# Patient Record
Sex: Female | Born: 1980 | Race: White | Hispanic: No | Marital: Married | Smoking: Never smoker
Health system: Southern US, Community
[De-identification: ages and names within clinical notes are randomized; demographics above are authoritative.]

## PROBLEM LIST (undated history)

## (undated) DIAGNOSIS — B373 Candidiasis of vulva and vagina: Secondary | ICD-10-CM

## (undated) DIAGNOSIS — N63 Unspecified lump in unspecified breast: Secondary | ICD-10-CM

## (undated) DIAGNOSIS — B3731 Acute candidiasis of vulva and vagina: Secondary | ICD-10-CM

## (undated) DIAGNOSIS — Z9289 Personal history of other medical treatment: Secondary | ICD-10-CM

## (undated) DIAGNOSIS — N390 Urinary tract infection, site not specified: Secondary | ICD-10-CM

## (undated) HISTORY — DX: Candidiasis of vulva and vagina: B37.3

## (undated) HISTORY — DX: Personal history of other medical treatment: Z92.89

## (undated) HISTORY — DX: Unspecified lump in unspecified breast: N63.0

## (undated) HISTORY — DX: Urinary tract infection, site not specified: N39.0

## (undated) HISTORY — DX: Acute candidiasis of vulva and vagina: B37.31

---

## 2007-06-22 ENCOUNTER — Ambulatory Visit: Payer: Self-pay | Admitting: Family Medicine

## 2007-06-26 ENCOUNTER — Ambulatory Visit: Payer: Self-pay | Admitting: Physician Assistant

## 2008-08-04 ENCOUNTER — Ambulatory Visit: Payer: Self-pay | Admitting: Obstetrics & Gynecology

## 2008-08-05 ENCOUNTER — Inpatient Hospital Stay: Payer: Self-pay | Admitting: Obstetrics & Gynecology

## 2013-12-17 DIAGNOSIS — Z9289 Personal history of other medical treatment: Secondary | ICD-10-CM

## 2013-12-17 HISTORY — DX: Personal history of other medical treatment: Z92.89

## 2013-12-17 LAB — HM PAP SMEAR

## 2014-05-06 DIAGNOSIS — N63 Unspecified lump in unspecified breast: Secondary | ICD-10-CM

## 2014-05-06 HISTORY — DX: Unspecified lump in unspecified breast: N63.0

## 2014-05-19 ENCOUNTER — Ambulatory Visit: Payer: Self-pay | Admitting: Podiatry

## 2014-05-21 ENCOUNTER — Ambulatory Visit: Payer: Self-pay | Admitting: Obstetrics and Gynecology

## 2014-05-21 LAB — HM MAMMOGRAPHY

## 2015-03-03 ENCOUNTER — Ambulatory Visit (INDEPENDENT_AMBULATORY_CARE_PROVIDER_SITE_OTHER): Payer: BLUE CROSS/BLUE SHIELD | Admitting: Family Medicine

## 2015-03-03 ENCOUNTER — Encounter: Payer: Self-pay | Admitting: Family Medicine

## 2015-03-03 VITALS — BP 104/68 | HR 102 | Temp 98.7°F | Resp 18 | Ht 63.0 in | Wt 113.2 lb

## 2015-03-03 DIAGNOSIS — J019 Acute sinusitis, unspecified: Secondary | ICD-10-CM | POA: Diagnosis not present

## 2015-03-03 DIAGNOSIS — J40 Bronchitis, not specified as acute or chronic: Secondary | ICD-10-CM | POA: Diagnosis not present

## 2015-03-03 MED ORDER — DIFLUCAN 50 MG PO TABS: 50.0000 mg | ORAL_TABLET | Freq: Every day | ORAL | Status: DC

## 2015-03-03 MED ORDER — BENZONATATE 100 MG PO CAPS: 100.0000 mg | ORAL_CAPSULE | Freq: Two times a day (BID) | ORAL | Status: DC | PRN

## 2015-03-03 MED ORDER — FLUTICASONE PROPIONATE 50 MCG/ACT NA SUSP: 2.0000 | Freq: Every day | NASAL | Status: DC

## 2015-03-03 MED ORDER — PREDNISONE 20 MG PO TABS: 20.0000 mg | ORAL_TABLET | Freq: Every day | ORAL | Status: DC

## 2015-03-03 NOTE — Progress Notes (Signed)
Name: Christina Davis   MRN: [id number]    DOB: Nov 26, 1980   Date:03/03/2015       Progress Note  Subjective  Chief Complaint  Chief Complaint  Patient presents with  . Sinusitis  . Cough    HPI  Sinusitis  Patient presents with greater than 7 day history of nasal congestion and drainage which is purulent in color. There is tenderness over the sinuses. There has been fever to - along with some associated chills on occasion. Usage of over-the-counter medications is not been affected. There is also accompanying cough productive of purulent sputum.  Bronchitis  Patient presents with a greater than 1 week history of cough productive of purulent sputum. The cough is irritating and keep the patient awake at night. There has no fever or chills.  Over-the-counter meds And completely effective.  No past medical history on file.  Social History  Substance Use Topics  . Smoking status: Never Smoker   . Smokeless tobacco: Not on file  . Alcohol Use: 0.0 oz/week    0 Standard drinks or equivalent per week     Current outpatient prescriptions:  .  aspirin EC 81 MG tablet, Take by mouth., Disp: , Rfl:  .  Multiple Vitamin (MULTI-VITAMINS) TABS, Take by mouth., Disp: , Rfl:  .  VELIVET 0.1/0.125/0.15 -0.025 MG tablet, , Disp: , Rfl: 1  Allergies  Allergen Reactions  . Amoxicillin Diarrhea    Review of Systems  Constitutional: Positive for chills and malaise/fatigue. Negative for fever and weight loss.  HENT: Positive for congestion and sore throat. Negative for hearing loss and tinnitus.   Eyes: Negative for blurred vision, double vision and redness.  Respiratory: Positive for cough and sputum production. Negative for hemoptysis and shortness of breath.   Cardiovascular: Negative for chest pain, palpitations, orthopnea, claudication and leg swelling.  Gastrointestinal: Negative for heartburn, nausea, vomiting, diarrhea, constipation and blood in stool.  Genitourinary: Negative for  dysuria, urgency, frequency and hematuria.  Musculoskeletal: Positive for myalgias. Negative for back pain, joint pain, falls and neck pain.  Skin: Negative for itching.  Neurological: Positive for weakness. Negative for dizziness, tingling, tremors, focal weakness, seizures, loss of consciousness and headaches.  Endo/Heme/Allergies: Does not bruise/bleed easily.  Psychiatric/Behavioral: Negative for depression and substance abuse. The patient is not nervous/anxious and does not have insomnia.      Objective  Filed Vitals:   03/03/15 1505  BP: 104/68  Pulse: 102  Temp: 98.7 F (37.1 C)  Resp: 18  Height: 5\' 3"  (1.6 m)  Weight: 113 lb 4 oz (51.37 kg)  SpO2: 98%     Physical Exam  Constitutional: She is oriented to person, place, and time and well-developed, well-nourished, and in no distress.  HENT:  Head: Normocephalic.  Bilateral nasal turbinate swelling with erythema posterior pharyngeal drainage which is purulent. Mild tenderness over frontal and maxillary sinuses.  Eyes: EOM are normal. Pupils are equal, round, and reactive to light.  Neck: Normal range of motion. No thyromegaly present.  Cardiovascular: Normal rate, regular rhythm and normal heart sounds.   No murmur heard. Pulmonary/Chest: Effort normal and breath sounds normal.  Abdominal: Soft. Bowel sounds are normal.  Musculoskeletal: Normal range of motion. She exhibits no edema.  Neurological: She is alert and oriented to person, place, and time. No cranial nerve deficit. Gait normal.  Skin: Skin is warm and dry. No rash noted.  Psychiatric: Memory and affect normal.      Assessment & Plan  1. Acute sinusitis,  recurrence not specified, unspecified location Also saline gargles and sinus washes with saline solution - predniSONE (DELTASONE) 20 MG tablet; Take 1 tablet (20 mg total) by mouth daily with breakfast.  Dispense: 10 tablet; Refill: 0 - fluticasone (FLONASE) 50 MCG/ACT nasal spray; Place 2 sprays into  both nostrils daily.  Dispense: 16 g; Refill: 6 - DIFLUCAN 50 MG tablet; Take 1 tablet (50 mg total) by mouth daily.  Dispense: 2 tablet; Refill: 0  2. Bronchitis Suggestion of humidified air - benzonatate (TESSALON) 100 MG capsule; Take 1 capsule (100 mg total) by mouth 2 (two) times daily as needed for cough.  Dispense: 20 capsule; Refill: 0 - predniSONE (DELTASONE) 20 MG tablet; Take 1 tablet (20 mg total) by mouth daily with breakfast.  Dispense: 10 tablet; Refill: 0

## 2015-03-04 ENCOUNTER — Other Ambulatory Visit: Payer: Self-pay

## 2015-03-04 DIAGNOSIS — J40 Bronchitis, not specified as acute or chronic: Secondary | ICD-10-CM

## 2015-03-04 DIAGNOSIS — J019 Acute sinusitis, unspecified: Secondary | ICD-10-CM

## 2015-03-04 MED ORDER — DIFLUCAN 50 MG PO TABS: 50.0000 mg | ORAL_TABLET | Freq: Every day | ORAL | Status: DC

## 2015-03-04 MED ORDER — AZITHROMYCIN 250 MG PO TABS: ORAL_TABLET | ORAL | Status: DC

## 2016-05-25 ENCOUNTER — Telehealth: Payer: Self-pay

## 2016-05-25 DIAGNOSIS — N938 Other specified abnormal uterine and vaginal bleeding: Secondary | ICD-10-CM

## 2016-05-25 NOTE — Telephone Encounter (Signed)
Pt called stating she called a few weeks ago and wants to talk to you about that same issue.  Please see [facility] task of 05/02/16.  I called pt, she states she started a new pack of pills, bleeding did stop but it's trying to come on one and a half weeks early.  She feels her period just isn't doing right on these pills.  Adv I would give you the msg.  She can be reached at this same # tomorrow. [phone]

## 2016-05-26 NOTE — Telephone Encounter (Signed)
Pt with DUB again on OCPs. Has been on several brands of pills that work well at first, then pt gets BTB. Sx resolve temporarily with BC change. Had neg TSH 2014 but no recent labs. Will check labs and f/u. If neg, suggested trying nuvaring for sx control. Pt on 3rd wk of pills currently. If still sx, will check u/s.

## 2016-11-03 ENCOUNTER — Other Ambulatory Visit: Payer: Self-pay

## 2016-11-03 MED ORDER — DESOGEST-ETH ESTRAD TRIPHASIC 0.1/0.125/0.15 -0.025 MG PO TABS
1.0000 | ORAL_TABLET | Freq: Every day | ORAL | 2 refills | Status: DC
Start: 2016-11-03 — End: 2017-01-11

## 2016-11-09 ENCOUNTER — Telehealth: Payer: Self-pay | Admitting: Obstetrics and Gynecology

## 2016-11-09 NOTE — Telephone Encounter (Signed)
Pt is schedule 01/09/17 with [facility]. And needs an refill for for [facility]control. [facility].

## 2016-11-09 NOTE — Telephone Encounter (Signed)
Pt aware rx sent to [facility] [address] on 11/03/16. [facility]

## 2017-01-09 ENCOUNTER — Ambulatory Visit: Payer: Self-pay | Admitting: Obstetrics and Gynecology

## 2017-01-11 MED ORDER — DESOGEST-ETH ESTRAD TRIPHASIC 0.1/0.125/0.15 -0.025 MG PO TABS: 1.0000 | ORAL_TABLET | Freq: Every day | ORAL | 0 refills | Status: DC

## 2017-01-11 NOTE — Telephone Encounter (Signed)
Pt started her menses & had to r/s her AE. Her apt is 02/14/17 & she will be out of OCP prior to apt. Requesting an additional refill to get her to apt.

## 2017-01-11 NOTE — Addendum Note (Signed)
Addended by: Meryl Dare on: 01/11/2017 10:00 AM   Modules accepted: Orders

## 2017-01-11 NOTE — Telephone Encounter (Signed)
Pt aware additional rf sent to [facility] [address]

## 2017-02-01 ENCOUNTER — Other Ambulatory Visit: Payer: Self-pay

## 2017-02-01 MED ORDER — DESOGEST-ETH ESTRAD TRIPHASIC 0.1/0.125/0.15 -0.025 MG PO TABS: 1.0000 | ORAL_TABLET | Freq: Every day | ORAL | 0 refills | Status: DC

## 2017-02-08 ENCOUNTER — Other Ambulatory Visit: Payer: Self-pay | Admitting: Obstetrics and Gynecology

## 2017-02-08 NOTE — Telephone Encounter (Signed)
Patient account linked. Forwarding to [facility]

## 2017-02-14 ENCOUNTER — Ambulatory Visit: Payer: Self-pay | Admitting: Obstetrics and Gynecology

## 2017-02-14 ENCOUNTER — Ambulatory Visit (INDEPENDENT_AMBULATORY_CARE_PROVIDER_SITE_OTHER): Payer: 59 | Admitting: Obstetrics and Gynecology

## 2017-02-14 ENCOUNTER — Encounter: Payer: Self-pay | Admitting: Obstetrics and Gynecology

## 2017-02-14 VITALS — BP 120/70 | HR 100 | Ht 63.0 in | Wt 113.0 lb

## 2017-02-14 DIAGNOSIS — D242 Benign neoplasm of left breast: Secondary | ICD-10-CM

## 2017-02-14 DIAGNOSIS — Z01419 Encounter for gynecological examination (general) (routine) without abnormal findings: Secondary | ICD-10-CM

## 2017-02-14 DIAGNOSIS — Z3041 Encounter for surveillance of contraceptive pills: Secondary | ICD-10-CM

## 2017-02-14 DIAGNOSIS — Z1151 Encounter for screening for human papillomavirus (HPV): Secondary | ICD-10-CM | POA: Diagnosis not present

## 2017-02-14 DIAGNOSIS — Z124 Encounter for screening for malignant neoplasm of cervix: Secondary | ICD-10-CM

## 2017-02-14 MED ORDER — DESOGEST-ETH ESTRAD TRIPHASIC 0.1/0.125/0.15 -0.025 MG PO TABS: 1.0000 | ORAL_TABLET | Freq: Every day | ORAL | 3 refills | Status: DC

## 2017-02-14 NOTE — Progress Notes (Addendum)
PCP:  Ashok Norris, MD   Chief Complaint  Patient presents with  . Gynecologic Exam     HPI:      [name] is a 36 y.o. [id number] who LMP was Patient's last menstrual period was 01/09/2017., presents today for her annual examination.  Her menses are regular every 2 months on OCPs, lasting 5 days.  Dysmenorrhea mild, occurring first 1-2 days of flow. She does not have intermenstrual bleeding.  Sex activity: single partner, contraception - OCP (estrogen/progesterone).  Last Pap: December 17, 2013  Results were: no abnormalities /neg HPV DNA  Hx of STDs: none  Last mammogram: May 21, 2014  Results were: cat 3 for probable fibroadenoma LT breast, 6 mo f/u u/s recommended. Pt feels that area is smaller and is not bothersome to her. She never had repeat u/s last yr. There is no FH of breast cancer. There is no FH of ovarian cancer. The patient does do self-breast exams.  Tobacco use: The patient denies current or previous tobacco use. Alcohol use: none No drug use.  Exercise: not active  She does get adequate calcium and Vitamin D in her diet.   Past Medical History:  Diagnosis Date  . Breast mass 05/2014   U/S REPEAT IN 9/16 (WASN'T DONE)  . History of Papanicolaou smear of cervix 12/17/2013   -/-  . UTI (urinary tract infection)   . Yeast vaginitis     Past Surgical History:  Procedure Laterality Date  . CESAREAN SECTION  2010    Family History  Problem Relation Age of Onset  . Depression Mother   . Stroke Maternal Grandfather     Social History   Socioeconomic History  . Marital status: Married    Spouse name: Not on file  . Number of children: 2  . Years of education: 60  . Highest education level: Not on file  Social Needs  . Financial resource strain: Not on file  . Food insecurity - worry: Not on file  . Food insecurity - inability: Not on file  . Transportation needs - medical: Not on file  . Transportation needs - non-medical: Not on  file  Occupational History  . Occupation: ADMINISTRATIVE ASSOCIATE    Comment: ASSISTANT  Tobacco Use  . Smoking status: Never Smoker  . Smokeless tobacco: Never Used  Substance and Sexual Activity  . Alcohol use: Yes    Alcohol/week: 0.0 oz    Comment: OCC  . Drug use: No  . Sexual activity: Yes    Birth control/protection: Pill  Other Topics Concern  . Not on file  Social History Narrative  . Not on file    Current Meds  Medication Sig  . desogestrel-ethinyl estradiol (VELIVET) 0.1/0.125/0.15 -0.025 MG tablet Take 1 tablet by mouth daily.  . [DISCONTINUED] VELIVET 0.1/0.125/0.15 -0.025 MG tablet TAKE 1 TABLET EVERY DAY     ROS:  Review of Systems  Constitutional: Negative for fatigue, fever and unexpected weight change.  Respiratory: Negative for cough, shortness of breath and wheezing.   Cardiovascular: Negative for chest pain, palpitations and leg swelling.  Gastrointestinal: Negative for blood in stool, constipation, diarrhea, nausea and vomiting.  Endocrine: Negative for cold intolerance, heat intolerance and polyuria.  Genitourinary: Negative for dyspareunia, dysuria, flank pain, frequency, genital sores, hematuria, menstrual problem, pelvic pain, urgency, vaginal bleeding, vaginal discharge and vaginal pain.  Musculoskeletal: Negative for back pain, joint swelling and myalgias.  Skin: Negative for rash.  Neurological: Negative for dizziness, syncope, light-headedness,  numbness and headaches.  Hematological: Negative for adenopathy.  Psychiatric/Behavioral: Negative for agitation, confusion, sleep disturbance and suicidal ideas. The patient is not nervous/anxious.      Objective: BP 120/70   Pulse 100   Ht 5\' 3"  (1.6 m)   Wt 113 lb (51.3 kg)   LMP 01/09/2017   BMI 20.02 kg/m    Physical Exam  Constitutional: She is oriented to person, place, and time. She appears well-developed and well-nourished.  Genitourinary: Vagina normal and uterus normal. There is  no rash or tenderness on the right labia. There is no rash or tenderness on the left labia. No erythema or tenderness in the vagina. No vaginal discharge found. Right adnexum does not display mass and does not display tenderness. Left adnexum does not display mass and does not display tenderness. Cervix does not exhibit motion tenderness or polyp. Uterus is not enlarged or tender.  Neck: Normal range of motion. No thyromegaly present.  Cardiovascular: Normal rate, regular rhythm and normal heart sounds.  No murmur heard. Pulmonary/Chest: Effort normal and breath sounds normal. Right breast exhibits no mass, no nipple discharge, no skin change and no tenderness. Left breast exhibits mass. Left breast exhibits no nipple discharge, no skin change and no tenderness.  LT BREAST 8:00 WITH ~1.5 X 2.0 CM FIRM, MOBILE MASS    Abdominal: Soft. There is no tenderness. There is no guarding.  Musculoskeletal: Normal range of motion.  Neurological: She is alert and oriented to person, place, and time. No cranial nerve deficit.  Skin: Skin is warm and dry.  Psychiatric: She has a normal mood and affect. Her behavior is normal. Thought content normal.  Vitals reviewed.    Assessment/Plan: Encounter for annual routine gynecological examination  Cervical cancer screening - Plan: IGP, Aptima HPV  Screening for HPV (human papillomavirus) - Plan: IGP, Aptima HPV  Encounter for surveillance of contraceptive pills - OCP RF. - Plan: desogestrel-ethinyl estradiol (VELIVET) 0.1/0.125/0.15 -0.025 MG tablet  Fibroadenoma of breast, left - Repeat u/s due. Pt to call to sched.  - Plan: US BREAST LTD UNI LEFT INC AXILLA, MM DIAG BREAST TOMO BILATERAL, US BREAST LTD UNI RIGHT INC AXILLA  Meds ordered this encounter  Medications  . desogestrel-ethinyl estradiol (VELIVET) 0.1/0.125/0.15 -0.025 MG tablet    Sig: Take 1 tablet by mouth daily.    Dispense:  84 tablet    Refill:  3             GYN counsel breast self  exam, adequate intake of calcium and vitamin D, diet and exercise     F/U  Return in about 1 year (around 02/14/2018).  Chiquita Heckert B. Seyon Strader, PA-C 02/15/2017 4:04 PM

## 2017-02-14 NOTE — Patient Instructions (Signed)
I value your feedback and entrusting us with your care. If you get a [place] patient survey, I would appreciate you taking the time to let us know about your experience today. Thank you! 

## 2017-02-15 NOTE — Addendum Note (Signed)
Addended by: Ardeth Perfect B on: [id number] 04:04 PM   Modules accepted: Orders

## 2017-02-18 LAB — IGP, APTIMA HPV
HPV Aptima: NEGATIVE
PAP SMEAR COMMENT: 0

## 2017-02-27 ENCOUNTER — Other Ambulatory Visit: Payer: Self-pay

## 2017-02-27 DIAGNOSIS — Z3041 Encounter for surveillance of contraceptive pills: Secondary | ICD-10-CM

## 2017-02-27 MED ORDER — DESOGEST-ETH ESTRAD TRIPHASIC 0.1/0.125/0.15 -0.025 MG PO TABS: 1.0000 | ORAL_TABLET | Freq: Every day | ORAL | 3 refills | Status: DC

## 2017-03-31 ENCOUNTER — Ambulatory Visit
Admission: RE | Admit: 2017-03-31 | Discharge: 2017-03-31 | Disposition: A | Discharge status: Home / Self Care | Payer: 59 | Source: Ambulatory Visit | Attending: Obstetrics and Gynecology | Admitting: Obstetrics and Gynecology

## 2017-03-31 ENCOUNTER — Encounter: Payer: Self-pay | Admitting: Radiology

## 2017-03-31 DIAGNOSIS — D242 Benign neoplasm of left breast: Secondary | ICD-10-CM

## 2017-03-31 DIAGNOSIS — R922 Inconclusive mammogram: Secondary | ICD-10-CM | POA: Diagnosis not present

## 2017-04-03 ENCOUNTER — Other Ambulatory Visit: Payer: Self-pay | Admitting: Obstetrics and Gynecology

## 2017-04-03 DIAGNOSIS — N6320 Unspecified lump in the left breast, unspecified quadrant: Secondary | ICD-10-CM

## 2017-04-03 DIAGNOSIS — R928 Other abnormal and inconclusive findings on diagnostic imaging of breast: Secondary | ICD-10-CM

## 2017-04-12 ENCOUNTER — Ambulatory Visit: Payer: 59

## 2017-05-31 ENCOUNTER — Ambulatory Visit
Admission: RE | Admit: 2017-05-31 | Discharge: 2017-05-31 | Disposition: A | Discharge status: Home / Self Care | Payer: 59 | Source: Ambulatory Visit | Attending: Obstetrics and Gynecology | Admitting: Obstetrics and Gynecology

## 2017-05-31 DIAGNOSIS — R928 Other abnormal and inconclusive findings on diagnostic imaging of breast: Secondary | ICD-10-CM

## 2017-05-31 DIAGNOSIS — N6320 Unspecified lump in the left breast, unspecified quadrant: Secondary | ICD-10-CM | POA: Diagnosis present

## 2017-05-31 DIAGNOSIS — N6324 Unspecified lump in the left breast, lower inner quadrant: Secondary | ICD-10-CM | POA: Diagnosis not present

## 2017-05-31 DIAGNOSIS — D242 Benign neoplasm of left breast: Secondary | ICD-10-CM | POA: Diagnosis not present

## 2017-05-31 HISTORY — PX: BREAST BIOPSY: SHX20

## 2017-06-01 LAB — SURGICAL PATHOLOGY

## 2017-06-12 ENCOUNTER — Encounter: Payer: Self-pay | Admitting: Obstetrics and Gynecology

## 2018-03-11 DIAGNOSIS — S0502XA Injury of conjunctiva and corneal abrasion without foreign body, left eye, initial encounter: Secondary | ICD-10-CM | POA: Diagnosis not present

## 2018-04-02 ENCOUNTER — Telehealth: Payer: Self-pay

## 2018-04-02 ENCOUNTER — Other Ambulatory Visit: Payer: Self-pay | Admitting: Obstetrics and Gynecology

## 2018-04-02 DIAGNOSIS — Z3041 Encounter for surveillance of contraceptive pills: Secondary | ICD-10-CM

## 2018-04-02 NOTE — Telephone Encounter (Signed)
Pt calling for refill of bcp.  She is out as of yesterday.  Knows she needs appt.  [phone]

## 2018-04-03 MED ORDER — DESOGEST-ETH ESTRAD TRIPHASIC 0.1/0.125/0.15 -0.025 MG PO TABS: 1.0000 | ORAL_TABLET | Freq: Every day | ORAL | 0 refills | Status: DC

## 2018-04-03 NOTE — Telephone Encounter (Signed)
Patient is schedule 04/18/18 with [facility] at 8:00 am. Please advise refill on prescription

## 2018-04-03 NOTE — Addendum Note (Signed)
Addended by: Cleophas Dunker D on: 04/03/2018 08:13 AM   Modules accepted: Orders

## 2018-04-03 NOTE — Telephone Encounter (Signed)
Pt aware. Adv to take two a day until she is caught up.

## 2018-04-17 NOTE — Progress Notes (Signed)
PCP:  Ashok Norris, MD   Chief Complaint  Patient presents with  . Gynecologic Exam     HPI:      [name] is a 38 y.o. [id number] who LMP was Patient's last menstrual period was 02/19/2018 (approximate)., presents today for her annual examination.  Her menses are regular every 2 months on OCPs, lasting 5 days.  Dysmenorrhea mod, occurring first 1-2 days of flow. She does not have intermenstrual bleeding.  Sex activity: single partner, contraception - OCP (estrogen/progesterone).  Last Pap: 02/14/17 Results were: no abnormalities /neg HPV DNA  Hx of STDs: none  Last mammogram: 03/31/17  Results were: cat 3 for probable fibroadenoma LT breast, 6 mo f/u u/s recommended. 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 Yearly mammo recommended.  There is no FH of breast cancer. There is no FH of ovarian cancer. The patient does do self-breast exams.  Tobacco use: The patient denies current or previous tobacco use. Alcohol use: social No drug use.  Exercise: not active  She does get adequate calcium but not Vitamin D in her diet.   Past Medical History:  Diagnosis Date  . Breast mass 05/2014   U/S REPEAT IN 9/16 (WASN'T DONE)  . History of Papanicolaou smear of cervix 12/17/2013   -/-  . UTI (urinary tract infection)   . Yeast vaginitis     Past Surgical History:  Procedure Laterality Date  . 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 CESAREAN SECTION  2010    Family History  Problem Relation Age of Onset  . Depression Mother   . Stroke Maternal Grandfather   . Breast cancer Neg Hx     Social History   Socioeconomic History  . Marital status: Married    Spouse name: Not on file  . Number of children: 2  . Years of education: 50  . Highest education level: Not on file  Occupational History  . Occupation: ADMINISTRATIVE ASSOCIATE    Comment: ASSISTANT  Social Needs  .  Financial resource strain: Not on file  . Food insecurity:    Worry: Not on file    Inability: Not on file  . Transportation needs:    Medical: Not on file    Non-medical: Not on file  Tobacco Use  . Smoking status: Never Smoker  . Smokeless tobacco: Never Used  Substance and Sexual Activity  . Alcohol use: Yes    Alcohol/week: 0.0 standard drinks    Comment: OCC  . Drug use: No  . Sexual activity: Yes    Birth control/protection: Pill  Lifestyle  . Physical activity:    Days per week: Not on file    Minutes per session: Not on file  . Stress: Not on file  Relationships  . Social connections:    Talks on phone: Not on file    Gets together: Not on file    Attends religious service: Not on file    Active member of club or organization: Not on file    Attends meetings of clubs or organizations: Not on file    Relationship status: Not on file  . Intimate partner violence:    Fear of current or ex partner: Not on file    Emotionally abused: Not on file    Physically abused: Not on file    Forced sexual activity: Not on file  Other Topics Concern  . Not on file  Social History  Narrative  . Not on file    Current Meds  Medication Sig  . aspirin EC 81 MG tablet Take by mouth.  . desogestrel-ethinyl estradiol (VELIVET) 0.1/0.125/0.15 -0.025 MG tablet Take 1 tablet by mouth daily.  . Multiple Vitamin (MULTI-VITAMINS) TABS Take by mouth.  . [DISCONTINUED] desogestrel-ethinyl estradiol (VELIVET) 0.1/0.125/0.15 -0.025 MG tablet Take 1 tablet by mouth daily.     ROS:  Review of Systems  Constitutional: Negative for fatigue, fever and unexpected weight change.  Respiratory: Negative for cough, shortness of breath and wheezing.   Cardiovascular: Negative for chest pain, palpitations and leg swelling.  Gastrointestinal: Negative for blood in stool, constipation, diarrhea, nausea and vomiting.  Endocrine: Negative for cold intolerance, heat intolerance and polyuria.    Genitourinary: Negative for dyspareunia, dysuria, flank pain, frequency, genital sores, hematuria, menstrual problem, pelvic pain, urgency, vaginal bleeding, vaginal discharge and vaginal pain.  Musculoskeletal: Negative for back pain, joint swelling and myalgias.  Skin: Negative for rash.  Neurological: Negative for dizziness, syncope, light-headedness, numbness and headaches.  Hematological: Negative for adenopathy.  Psychiatric/Behavioral: Negative for agitation, confusion, sleep disturbance and suicidal ideas. The patient is not nervous/anxious.      Objective: BP 100/62   Pulse 83   Ht 5\' 3"  (1.6 m)   Wt 114 lb (51.7 kg)   LMP 02/19/2018 (Approximate)   BMI 20.19 kg/m    Physical Exam Constitutional:      General: She is not in acute distress.    Appearance: She is well-developed.  Genitourinary:     Vulva, vagina, uterus, right adnexa and left adnexa normal.     No vulval lesion, tenderness or ulcerations noted.     No vaginal discharge, erythema, tenderness or bleeding.     No cervical motion tenderness or polyp.     Uterus is not enlarged or tender.     No right or left adnexal mass present.     Right adnexa not tender.     Left adnexa not tender.  Neck:     Musculoskeletal: Normal range of motion.     Thyroid: No thyromegaly.  Cardiovascular:     Rate and Rhythm: Normal rate and regular rhythm.     Heart sounds: Normal heart sounds. No murmur.  Pulmonary:     Effort: Pulmonary effort is normal.     Breath sounds: Normal breath sounds.  Chest:     Breasts:        Right: No mass, nipple discharge, skin change or tenderness.        Left: Mass present. No nipple discharge, skin change or tenderness.    Abdominal:     Palpations: Abdomen is soft.     Tenderness: There is no abdominal tenderness. There is no guarding.  Musculoskeletal: Normal range of motion.  Neurological:     General: No focal deficit present.     Mental Status: She is alert and oriented to  person, place, and time.     Cranial Nerves: No cranial nerve deficit.  Skin:    General: Skin is warm and dry.  Psychiatric:        Mood and Affect: Mood normal.        Behavior: Behavior normal.        Thought Content: Thought content normal.        Judgment: Judgment normal.  Vitals signs and nursing note reviewed.     Assessment/Plan: Encounter for annual routine gynecological examination  Encounter for surveillance of contraceptive pills - OCP  RF. - Plan: desogestrel-ethinyl estradiol (VELIVET) 0.1/0.125/0.15 -0.025 MG tablet  Screening for breast cancer - Pt to sched mammo - Plan: MM 3D SCREEN BREAST BILATERAL  Meds ordered this encounter  Medications  . desogestrel-ethinyl estradiol (VELIVET) 0.1/0.125/0.15 -0.025 MG tablet    Sig: Take 1 tablet by mouth daily.    Dispense:  84 tablet    Refill:  3    Order Specific Question:   Supervising Provider    Answer:   Gae Dry [[id number]]             GYN counsel breast self exam, adequate intake of calcium and vitamin D, diet and exercise     F/U  Return in about 1 year (around 04/19/2019).  Tyrell Brereton B. Ting Cage, PA-C 04/18/2018 9:30 AM

## 2018-04-18 ENCOUNTER — Encounter: Payer: Self-pay | Admitting: Obstetrics and Gynecology

## 2018-04-18 ENCOUNTER — Ambulatory Visit (INDEPENDENT_AMBULATORY_CARE_PROVIDER_SITE_OTHER): Payer: 59 | Admitting: Obstetrics and Gynecology

## 2018-04-18 VITALS — BP 100/62 | HR 83 | Ht 63.0 in | Wt 114.0 lb

## 2018-04-18 DIAGNOSIS — Z1239 Encounter for other screening for malignant neoplasm of breast: Secondary | ICD-10-CM

## 2018-04-18 DIAGNOSIS — Z01419 Encounter for gynecological examination (general) (routine) without abnormal findings: Secondary | ICD-10-CM

## 2018-04-18 DIAGNOSIS — Z3041 Encounter for surveillance of contraceptive pills: Secondary | ICD-10-CM

## 2018-04-18 MED ORDER — DESOGEST-ETH ESTRAD TRIPHASIC 0.1/0.125/0.15 -0.025 MG PO TABS: 1.0000 | ORAL_TABLET | Freq: Every day | ORAL | 3 refills | Status: DC

## 2018-04-18 NOTE — Patient Instructions (Signed)
I value your feedback and entrusting us with your care. If you get a [place] patient survey, I would appreciate you taking the time to let us know about your experience today. Thank you!  [facility]: [phone]    

## 2018-05-25 ENCOUNTER — Telehealth: Payer: Self-pay

## 2018-05-25 NOTE — Telephone Encounter (Signed)
Pt called stating pharm doesn't have rx for bc.  [facility] was going to send it in. Should have been sent to [place] and [facility]. [phone]  Called pharm. Spoke c female. They have pt with the last name of Byerly - same DOB and address.  They had rx and will get it ready for pt.  Adv pt to take her ins card in so they can get the correct information into their system.

## 2018-06-17 ENCOUNTER — Other Ambulatory Visit: Payer: Self-pay | Admitting: Obstetrics and Gynecology

## 2018-06-17 DIAGNOSIS — Z3041 Encounter for surveillance of contraceptive pills: Secondary | ICD-10-CM

## 2018-11-20 ENCOUNTER — Ambulatory Visit (INDEPENDENT_AMBULATORY_CARE_PROVIDER_SITE_OTHER): Payer: 59 | Admitting: Obstetrics and Gynecology

## 2018-11-20 ENCOUNTER — Other Ambulatory Visit: Payer: Self-pay

## 2018-11-20 ENCOUNTER — Ambulatory Visit: Payer: 59 | Admitting: Obstetrics and Gynecology

## 2018-11-20 ENCOUNTER — Encounter: Payer: Self-pay | Admitting: Obstetrics and Gynecology

## 2018-11-20 VITALS — BP 100/80 | Ht 63.0 in | Wt 116.0 lb

## 2018-11-20 DIAGNOSIS — N898 Other specified noninflammatory disorders of vagina: Secondary | ICD-10-CM

## 2018-11-20 MED ORDER — FLUCONAZOLE 150 MG PO TABS: 150.0000 mg | ORAL_TABLET | Freq: Once | ORAL | 0 refills | Status: AC

## 2018-11-20 NOTE — Progress Notes (Signed)
Christina Norris, MD   Chief Complaint  Patient presents with  . Vaginitis    discharge, little itchiness and irritation, no odor x 2 days    HPI:      [name] is a 38 y.o. [id number] who LMP was No LMP recorded (lmp unknown). (Menstrual status: Oral contraceptives)., presents today for increased vag d/c without irritation/fishy odor for 2 days. Hx of yeast vag in past, but not recently. OTC yeast meds don't work for her. No prior abx use, no new soaps/detergents. No meds to treat, no urin sx.    Past Medical History:  Diagnosis Date  . Breast mass 05/2014   U/S REPEAT IN 9/16 (WASN'T DONE)  . History of Papanicolaou smear of cervix 12/17/2013   -/-  . UTI (urinary tract infection)   . Yeast vaginitis     Past Surgical History:  Procedure Laterality Date  . 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 CESAREAN SECTION  2010    Family History  Problem Relation Age of Onset  . Depression Mother   . Stroke Maternal Grandfather   . Breast cancer Neg Hx     Social History   Socioeconomic History  . Marital status: Married    Spouse name: Not on file  . Number of children: 2  . Years of education: 76  . Highest education level: Not on file  Occupational History  . Occupation: ADMINISTRATIVE ASSOCIATE    Comment: ASSISTANT  Social Needs  . Financial resource strain: Not on file  . Food insecurity    Worry: Not on file    Inability: Not on file  . Transportation needs    Medical: Not on file    Non-medical: Not on file  Tobacco Use  . Smoking status: Never Smoker  . Smokeless tobacco: Never Used  Substance and Sexual Activity  . Alcohol use: Yes    Alcohol/week: 0.0 standard drinks    Comment: OCC  . Drug use: No  . Sexual activity: Yes    Birth control/protection: Pill  Lifestyle  . Physical activity    Days per week: Not on file    Minutes per session: Not on file  . Stress: Not on  file  Relationships  . Social Herbalist on phone: Not on file    Gets together: Not on file    Attends religious service: Not on file    Active member of club or organization: Not on file    Attends meetings of clubs or organizations: Not on file    Relationship status: Not on file  . Intimate partner violence    Fear of current or ex partner: Not on file    Emotionally abused: Not on file    Physically abused: Not on file    Forced sexual activity: Not on file  Other Topics Concern  . Not on file  Social History Narrative  . Not on file    Outpatient Medications Prior to Visit  Medication Sig Dispense Refill  . Multiple Vitamin (MULTI-VITAMINS) TABS Take by mouth.    . VELIVET 0.1/0.125/0.15 -0.025 MG tablet TAKE 1 TABLET BY MOUTH EVERY DAY 84 tablet 0  . aspirin EC 81 MG tablet Take by mouth.     No facility-administered medications prior to visit.       ROS:  Review of Systems  Constitutional: Negative for fever.  Gastrointestinal: Negative for blood in stool, constipation,  diarrhea, nausea and vomiting.  Genitourinary: Positive for vaginal discharge. Negative for dyspareunia, dysuria, flank pain, frequency, hematuria, urgency, vaginal bleeding and vaginal pain.  Musculoskeletal: Negative for back pain.  Skin: Negative for rash.    OBJECTIVE:   Vitals:  BP 100/80   Ht 5\' 3"  (1.6 m)   Wt 116 lb (52.6 kg)   LMP  (LMP Unknown)   BMI 20.55 kg/m   Physical Exam Vitals signs reviewed.  Constitutional:      Appearance: She is well-developed.  Neck:     Musculoskeletal: Normal range of motion.  Pulmonary:     Effort: Pulmonary effort is normal.  Genitourinary:    General: Normal vulva.     Pubic Area: No rash.      Labia:        Right: No rash, tenderness or lesion.        Left: No rash, tenderness or lesion.      Vagina: Normal. No vaginal discharge, erythema or tenderness.     Cervix: Normal.     Uterus: Normal. Not enlarged and not tender.       Adnexa: Right adnexa normal and left adnexa normal.       Right: No mass or tenderness.         Left: No mass or tenderness.    Musculoskeletal: Normal range of motion.  Skin:    General: Skin is warm and dry.  Neurological:     General: No focal deficit present.     Mental Status: She is alert and oriented to person, place, and time.  Psychiatric:        Mood and Affect: Mood normal.        Behavior: Behavior normal.        Thought Content: Thought content normal.        Judgment: Judgment normal.     Results: Results for orders placed or performed in visit on 11/20/18 (from the past 24 hour(s))  POCT Wet Prep with KOH     Status: Normal   Collection Time: 11/21/18  8:47 AM  Result Value Ref Range   Trichomonas, UA Negative    Clue Cells Wet Prep HPF POC neg    Epithelial Wet Prep HPF POC     Yeast Wet Prep HPF POC neg    Bacteria Wet Prep HPF POC     RBC Wet Prep HPF POC     WBC Wet Prep HPF POC     KOH Prep POC Negative Negative     Assessment/Plan: Vaginal discharge - Plan: fluconazole (DIFLUCAN) 150 MG tablet, POCT Wet Prep with KOH; Neg wet prep/exam. No irritation. Question early yeast vs normal d/c. Rx diflucan eRxd. Pt to see if sx develop and will treat prn. F/u if sx change to BV type sx.    Meds ordered this encounter  Medications  . fluconazole (DIFLUCAN) 150 MG tablet    Sig: Take 1 tablet (150 mg total) by mouth once for 1 dose.    Dispense:  1 tablet    Refill:  0    Order Specific Question:   Supervising Provider    Answer:   Gae Dry [id number]      Return if symptoms worsen or fail to improve.  Shandelle Borrelli B. Cipriana Biller, PA-C 11/21/2018 8:48 AM

## 2018-11-20 NOTE — Patient Instructions (Signed)
I value your feedback and entrusting us with your care. If you get a [facility] patient survey, I would appreciate you taking the time to let us know about your experience today. Thank you! 

## 2018-11-21 ENCOUNTER — Encounter: Payer: Self-pay | Admitting: Obstetrics and Gynecology

## 2018-11-21 LAB — POCT WET PREP WITH KOH
Clue Cells Wet Prep HPF POC: NEGATIVE
KOH Prep POC: NEGATIVE
Trichomonas, UA: NEGATIVE
Yeast Wet Prep HPF POC: NEGATIVE

## 2019-04-25 ENCOUNTER — Telehealth: Payer: Self-pay | Admitting: Obstetrics and Gynecology

## 2019-04-25 ENCOUNTER — Other Ambulatory Visit: Payer: Self-pay

## 2019-04-25 DIAGNOSIS — Z3041 Encounter for surveillance of contraceptive pills: Secondary | ICD-10-CM

## 2019-04-25 MED ORDER — VELIVET 0.1/0.125/0.15 -0.025 MG PO TABS: 1.0000 | ORAL_TABLET | Freq: Every day | ORAL | 0 refills | Status: DC

## 2019-04-25 NOTE — Telephone Encounter (Signed)
Patient needs refill on bc, annual scheduled 2/24 but will run out before that time.  Russell

## 2019-04-25 NOTE — Telephone Encounter (Signed)
RF sent.

## 2019-04-27 ENCOUNTER — Other Ambulatory Visit: Payer: Self-pay | Admitting: Obstetrics and Gynecology

## 2019-04-27 DIAGNOSIS — Z3041 Encounter for surveillance of contraceptive pills: Secondary | ICD-10-CM

## 2019-04-30 NOTE — Patient Instructions (Addendum)
I value your feedback and entrusting us with your care. If you get a [facility] patient survey, I would appreciate you taking the time to let us know about your experience today. Thank you! ° °As of February 14, 2019, your lab results will be released to your MyChart immediately, before I even have a chance to see them. Please give me time to review them and contact you if there are any abnormalities. Thank you for your patience.  ° °[facility]: [phone] ° ° ° °

## 2019-04-30 NOTE — Progress Notes (Signed)
PCP:  Ashok Norris, MD   Chief Complaint  Patient presents with  . Gynecologic Exam     HPI:      Christina Davis is a 39 y.o. [id number] who LMP was Patient's last menstrual period was 04/24/2019 (approximate)., presents today for her annual examination.  Her menses are regular every 2-3 months with OCPs, lasting 3-5 days.  Dysmenorrhea mod, occurring first 1-2 days of flow. She does nothave intermenstrual bleeding.  Sex activity: single partner, contraception - OCP (estrogen/progesterone).  Last Pap: 02/14/17 Results were: no abnormalities /neg HPV DNA  Hx of STDs: none  Last mammogram: 03/31/17  Results were: cat 3 for probable fibroadenoma LT breast.  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 Yearly mammo recommended; hasn't had one yet. Concerned about risk of radiation. Fibroadenoma is stable per pt. There is no FH of breast cancer. There is no FH of ovarian cancer. The patient does do self-breast exams.  Tobacco use: The patient denies current or previous tobacco use. Alcohol use: social No drug use.  Exercise: mod active  She does get adequate calcium and Vitamin D in her diet. No recent labs, PCP retired.   Past Medical History:  Diagnosis Date  . Breast mass 05/2014   U/S REPEAT IN 9/16 (WASN'T DONE)  . History of Papanicolaou smear of cervix 12/17/2013   -/-  . UTI (urinary tract infection)   . Yeast vaginitis     Past Surgical History:  Procedure Laterality Date  . 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 CESAREAN SECTION  2010    Family History  Problem Relation Age of Onset  . Depression Mother   . Stroke Maternal Grandfather   . Breast cancer Neg Hx     Social History   Socioeconomic History  . Marital status: Married    Spouse name: Not on file  . Number of children: 2  . Years of education: 54  . Highest education level: Not on file  Occupational  History  . Occupation: ADMINISTRATIVE ASSOCIATE    Comment: ASSISTANT  Tobacco Use  . Smoking status: Never Smoker  . Smokeless tobacco: Never Used  Substance and Sexual Activity  . Alcohol use: Yes    Alcohol/week: 0.0 standard drinks    Comment: OCC  . Drug use: No  . Sexual activity: Yes    Birth control/protection: Pill  Other Topics Concern  . Not on file  Social History Narrative  . Not on file   Social Determinants of Health   Financial Resource Strain:   . Difficulty of Paying Living Expenses: Not on file  Food Insecurity:   . Worried About Charity fundraiser in the Last Year: Not on file  . Ran Out of Food in the Last Year: Not on file  Transportation Needs:   . Lack of Transportation (Medical): Not on file  . Lack of Transportation (Non-Medical): Not on file  Physical Activity:   . Days of Exercise per Week: Not on file  . Minutes of Exercise per Session: Not on file  Stress:   . Feeling of Stress : Not on file  Social Connections:   . Frequency of Communication with Friends and Family: Not on file  . Frequency of Social Gatherings with Friends and Family: Not on file  . Attends Religious Services: Not on file  . Active Member of Clubs or Organizations: Not on file  . Attends Club or  Organization Meetings: Not on file  . Marital Status: Not on file  Intimate Partner Violence:   . Fear of Current or Ex-Partner: Not on file  . Emotionally Abused: Not on file  . Physically Abused: Not on file  . Sexually Abused: Not on file    Current Meds  Medication Sig  . Multiple Vitamin (MULTI-VITAMINS) TABS Take by mouth.  . [DISCONTINUED] VELIVET 0.1/0.125/0.15 -0.025 MG tablet TAKE 1 TABLET BY MOUTH DAILY  . desogestrel-ethinyl estradiol (VELIVET) 0.1/0.125/0.15 -0.025 MG tablet Take 1 tablet by mouth daily.     ROS:  Review of Systems  Constitutional: Negative for fatigue, fever and unexpected weight change.  Respiratory: Negative for cough, shortness of  breath and wheezing.   Cardiovascular: Negative for chest pain, palpitations and leg swelling.  Gastrointestinal: Negative for blood in stool, constipation, diarrhea, nausea and vomiting.  Endocrine: Negative for cold intolerance, heat intolerance and polyuria.  Genitourinary: Negative for dyspareunia, dysuria, flank pain, frequency, genital sores, hematuria, menstrual problem, pelvic pain, urgency, vaginal bleeding, vaginal discharge and vaginal pain.  Musculoskeletal: Negative for back pain, joint swelling and myalgias.  Skin: Negative for rash.  Neurological: Negative for dizziness, syncope, light-headedness, numbness and headaches.  Hematological: Negative for adenopathy.  Psychiatric/Behavioral: Negative for agitation, confusion, sleep disturbance and suicidal ideas. The patient is not nervous/anxious.      Objective: BP 120/80   Ht 5\' 3"  (1.6 m)   Wt 117 lb (53.1 kg)   LMP 04/24/2019 (Approximate)   BMI 20.73 kg/m    Physical Exam Constitutional:      General: She is not in acute distress.    Appearance: She is well-developed.  Genitourinary:     Vulva, vagina, uterus, right adnexa and left adnexa normal.     No vulval lesion, tenderness or ulcerations noted.     No vaginal discharge, erythema, tenderness or bleeding.     No cervical motion tenderness or polyp.     Uterus is not enlarged or tender.     No right or left adnexal mass present.     Right adnexa not tender.     Left adnexa not tender.  Neck:     Thyroid: No thyromegaly.  Cardiovascular:     Rate and Rhythm: Normal rate and regular rhythm.     Heart sounds: Normal heart sounds. No murmur.  Pulmonary:     Effort: Pulmonary effort is normal.     Breath sounds: Normal breath sounds.  Chest:     Breasts:        Right: No mass, nipple discharge, skin change or tenderness.        Left: Mass present. No nipple discharge, skin change or tenderness.    Abdominal:     Palpations: Abdomen is soft.      Tenderness: There is no abdominal tenderness. There is no guarding.  Musculoskeletal:        General: Normal range of motion.     Cervical back: Normal range of motion.  Neurological:     General: No focal deficit present.     Mental Status: She is alert and oriented to person, place, and time.     Cranial Nerves: No cranial nerve deficit.  Skin:    General: Skin is warm and dry.  Psychiatric:        Mood and Affect: Mood normal.        Behavior: Behavior normal.        Thought Content: Thought content normal.  Judgment: Judgment normal.  Vitals and nursing note reviewed.     Assessment/Plan: Encounter for annual routine gynecological examination  Encounter for surveillance of contraceptive pills - Plan: desogestrel-ethinyl estradiol (VELIVET) 0.1/0.125/0.15 -0.025 MG tablet; OCP RF  Encounter for screening mammogram for malignant neoplasm of breast - Plan: MM 3D SCREEN BREAST BILATERAL; Encouraged pt to sched mammo, particularly with fibroadenoma. Benefits>risks of mammo radiation.  Fibroadenoma of left breast--stable per pt.  Blood tests for routine general physical examination - Plan: Comprehensive metabolic panel, Lipid panel, Lipid panel, Comprehensive metabolic panel  Screening cholesterol level - Plan: Lipid panel, Lipid panel   Meds ordered this encounter  Medications  . desogestrel-ethinyl estradiol (VELIVET) 0.1/0.125/0.15 -0.025 MG tablet    Sig: Take 1 tablet by mouth daily.    Dispense:  84 tablet    Refill:  3    Order Specific Question:   Supervising Provider    Answer:   Gae Dry [id number]             GYN counsel breast self exam, adequate intake of calcium and vitamin D, diet and exercise     F/U  Return in about 1 year (around 04/30/2020).  Cloyde Oregel B. Alegandra Sommers, PA-C 05/01/2019 4:32 PM

## 2019-05-01 ENCOUNTER — Other Ambulatory Visit: Payer: Self-pay

## 2019-05-01 ENCOUNTER — Ambulatory Visit (INDEPENDENT_AMBULATORY_CARE_PROVIDER_SITE_OTHER): Payer: 59 | Admitting: Obstetrics and Gynecology

## 2019-05-01 ENCOUNTER — Encounter: Payer: Self-pay | Admitting: Obstetrics and Gynecology

## 2019-05-01 VITALS — BP 120/80 | Ht 63.0 in | Wt 117.0 lb

## 2019-05-01 DIAGNOSIS — Z01411 Encounter for gynecological examination (general) (routine) with abnormal findings: Secondary | ICD-10-CM

## 2019-05-01 DIAGNOSIS — Z0000 Encounter for general adult medical examination without abnormal findings: Secondary | ICD-10-CM

## 2019-05-01 DIAGNOSIS — Z3041 Encounter for surveillance of contraceptive pills: Secondary | ICD-10-CM | POA: Diagnosis not present

## 2019-05-01 DIAGNOSIS — D242 Benign neoplasm of left breast: Secondary | ICD-10-CM

## 2019-05-01 DIAGNOSIS — Z13220 Encounter for screening for lipoid disorders: Secondary | ICD-10-CM

## 2019-05-01 DIAGNOSIS — Z01419 Encounter for gynecological examination (general) (routine) without abnormal findings: Secondary | ICD-10-CM

## 2019-05-01 DIAGNOSIS — Z1231 Encounter for screening mammogram for malignant neoplasm of breast: Secondary | ICD-10-CM

## 2019-05-01 MED ORDER — VELIVET 0.1/0.125/0.15 -0.025 MG PO TABS: 1.0000 | ORAL_TABLET | Freq: Every day | ORAL | 3 refills | Status: DC

## 2019-12-19 ENCOUNTER — Ambulatory Visit: Payer: 59 | Admitting: Physician Assistant

## 2020-02-04 ENCOUNTER — Ambulatory Visit: Payer: 59 | Admitting: Dermatology

## 2020-06-01 ENCOUNTER — Other Ambulatory Visit: Payer: Self-pay | Admitting: Obstetrics and Gynecology

## 2020-06-01 DIAGNOSIS — Z3041 Encounter for surveillance of contraceptive pills: Secondary | ICD-10-CM

## 2020-07-01 ENCOUNTER — Ambulatory Visit: Payer: 59 | Admitting: Dermatology

## 2020-07-01 ENCOUNTER — Other Ambulatory Visit: Payer: Self-pay

## 2020-07-01 DIAGNOSIS — L650 Telogen effluvium: Secondary | ICD-10-CM

## 2020-07-01 DIAGNOSIS — B070 Plantar wart: Secondary | ICD-10-CM | POA: Diagnosis not present

## 2020-07-11 ENCOUNTER — Encounter: Payer: Self-pay | Admitting: Dermatology

## 2020-07-11 NOTE — Addendum Note (Signed)
Addended by: Lavonna Monarch on: 07/11/2020 02:34 PM   Modules accepted: Level of Service

## 2020-07-11 NOTE — Progress Notes (Signed)
   Follow-Up Visit   Subjective  Christina Davis is a 40 y.o. female who presents for the following: Warts (Patient has planter wart on right foot. Christina Davis patient wants a suggestion on something for hair loss she could try. Started back in the winter. Patient thinks it could be hereditary cause mom doesn't have much hair. ).  Wart on bottom of right foot and would like to discuss hair thinning. Location:  Duration:  Quality:  Associated Signs/Symptoms: Modifying Factors:  Severity:  Timing: Context:   Objective  Well appearing patient in no apparent distress; mood and affect are within normal limits. Objective  Right 2nd Toe: Hyperkeratosis, slightly tender, wart versus clavus.  Objective  Mid Frontal Scalp: No morphological pattern with loss of density but slightly positive hair pull diffusely.  Normal scalp.  No adenopathy.    A focused examination was performed including Head, neck, feet.. Relevant physical exam findings are noted in the Assessment and Plan.   Assessment & Plan    Plantar wart Right 2nd Toe  Pared and dca   Telogen effluvium Mid Frontal Scalp  Discussed multiple possible causes of hair loss along with a strong possibility that even if no trigger can be identified, the prognosis for telogen effluvium is very positive.  Will defer any evaluation or therapeutic considerations such as minoxidil for the next 6 months.      I, Christina Monarch, MD, have reviewed all documentation for this visit.  The documentation on 07/11/20 for the exam, diagnosis, procedures, and orders are all accurate and complete.

## 2020-08-05 ENCOUNTER — Encounter: Payer: Self-pay | Admitting: Obstetrics and Gynecology

## 2020-08-05 ENCOUNTER — Other Ambulatory Visit (HOSPITAL_COMMUNITY)
Admission: RE | Admit: 2020-08-05 | Discharge: 2020-08-05 | Disposition: A | Discharge status: Home / Self Care | Payer: 59 | Source: Ambulatory Visit | Attending: Obstetrics and Gynecology | Admitting: Obstetrics and Gynecology

## 2020-08-05 ENCOUNTER — Other Ambulatory Visit: Payer: Self-pay

## 2020-08-05 ENCOUNTER — Ambulatory Visit (INDEPENDENT_AMBULATORY_CARE_PROVIDER_SITE_OTHER): Payer: 59 | Admitting: Obstetrics and Gynecology

## 2020-08-05 VITALS — BP 100/70 | Ht 63.0 in | Wt 114.0 lb

## 2020-08-05 DIAGNOSIS — Z1151 Encounter for screening for human papillomavirus (HPV): Secondary | ICD-10-CM

## 2020-08-05 DIAGNOSIS — Z01419 Encounter for gynecological examination (general) (routine) without abnormal findings: Secondary | ICD-10-CM | POA: Diagnosis not present

## 2020-08-05 DIAGNOSIS — Z124 Encounter for screening for malignant neoplasm of cervix: Secondary | ICD-10-CM

## 2020-08-05 DIAGNOSIS — Z3041 Encounter for surveillance of contraceptive pills: Secondary | ICD-10-CM

## 2020-08-05 DIAGNOSIS — Z1231 Encounter for screening mammogram for malignant neoplasm of breast: Secondary | ICD-10-CM

## 2020-08-05 DIAGNOSIS — Z0000 Encounter for general adult medical examination without abnormal findings: Secondary | ICD-10-CM

## 2020-08-05 DIAGNOSIS — Z13220 Encounter for screening for lipoid disorders: Secondary | ICD-10-CM

## 2020-08-05 MED ORDER — VELIVET 0.1/0.125/0.15 -0.025 MG PO TABS: 1.0000 | ORAL_TABLET | Freq: Every day | ORAL | 3 refills | Status: DC

## 2020-08-05 NOTE — Progress Notes (Signed)
PCP:  Ashok Norris, MD   Chief Complaint  Patient presents with  . Gynecologic Exam    No concerns     HPI:      [name] is a 40 y.o. [id number] who LMP was Patient's last menstrual period was 07/28/2020 (approximate)., presents today for her annual examination.  Her menses are regular every 2-3 months with OCPs, lasting 3-5 days.  Dysmenorrhea mild, occurring first 1-2 days of flow. She does not have intermenstrual bleeding. Ran out of pills 6 wks ago. Menses monthly, heavier and with worse cramping. Wants to restart OCPs.  Has had issues with increased acne recently, maybe improved since off OCPs, but hasn't had this issue in the past. Has been under increased stress. Likes these pills and hesitant to try a different BC.   Sex activity: single partner, contraception - OCP (estrogen/progesterone).  Last Pap: 02/14/17 Results were: no abnormalities /neg HPV DNA  Hx of STDs: none  Last mammogram: 03/31/17  Results were: cat 3 for probable fibroadenoma LT breast.  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 Yearly mammo recommended; hasn't had one yet. Concerned about risk of radiation but plans to do it this yr. 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 There is no FH of breast cancer. There is no FH of ovarian cancer. The patient does do self-breast exams.  Tobacco use: The patient denies current or previous tobacco use. Alcohol use: social No drug use.  Exercise: mod active  She does get adequate calcium and Vitamin D in her diet. No recent labs, PCP retired. Didn't do last yr  Past Medical History:  Diagnosis Date  . Breast mass 05/2014   U/S REPEAT IN 9/16 (WASN'T DONE)  . History of Papanicolaou smear of cervix 12/17/2013   -/-  . UTI (urinary tract infection)   . Yeast vaginitis     Past Surgical History:  Procedure Laterality Date  . BREAST BIOPSY Left 05/31/2017   PSEUDO-ANGIOMATOUS STROMAL HYPERPLASIA, neg for  atypia/malignancy  . CESAREAN SECTION  2010    Family History  Problem Relation Age of Onset  . Depression Mother   . Stroke Maternal Grandfather   . Breast cancer Neg Hx     Social History   Socioeconomic History  . Marital status: Married    Spouse name: Not on file  . Number of children: 2  . Years of education: 50  . Highest education level: Not on file  Occupational History  . Occupation: ADMINISTRATIVE ASSOCIATE    Comment: ASSISTANT  Tobacco Use  . Smoking status: Never Smoker  . Smokeless tobacco: Never Used  Vaping Use  . Vaping Use: Never used  Substance and Sexual Activity  . Alcohol use: Yes    Alcohol/week: 0.0 standard drinks    Comment: OCC  . Drug use: No  . Sexual activity: Yes    Birth control/protection: None  Other Topics Concern  . Not on file  Social History Narrative  . Not on file   Social Determinants of Health   Financial Resource Strain: Not on file  Food Insecurity: Not on file  Transportation Needs: Not on file  Physical Activity: Not on file  Stress: Not on file  Social Connections: Not on file  Intimate Partner Violence: Not on file    Current Meds  Medication Sig  . Multiple Vitamin (MULTI-VITAMINS) TABS Take by mouth.     ROS:  Review of Systems  Constitutional: Negative for fatigue, fever  and unexpected weight change.  Respiratory: Negative for cough, shortness of breath and wheezing.   Cardiovascular: Negative for chest pain, palpitations and leg swelling.  Gastrointestinal: Negative for blood in stool, constipation, diarrhea, nausea and vomiting.  Endocrine: Negative for cold intolerance, heat intolerance and polyuria.  Genitourinary: Negative for dyspareunia, dysuria, flank pain, frequency, genital sores, hematuria, menstrual problem, pelvic pain, urgency, vaginal bleeding, vaginal discharge and vaginal pain.  Musculoskeletal: Negative for back pain, joint swelling and myalgias.  Skin: Negative for rash.   Neurological: Negative for dizziness, syncope, light-headedness, numbness and headaches.  Hematological: Negative for adenopathy.  Psychiatric/Behavioral: Negative for agitation, confusion, sleep disturbance and suicidal ideas. The patient is not nervous/anxious.      Objective: BP 100/70   Ht 5\' 3"  (1.6 m)   Wt 114 lb (51.7 kg)   LMP 07/28/2020 (Approximate)   BMI 20.19 kg/m    Physical Exam Constitutional:      General: She is not in acute distress.    Appearance: She is well-developed.  Genitourinary:     Vulva normal.     Right Labia: No rash, tenderness or lesions.    Left Labia: No tenderness, lesions or rash.    No vaginal discharge, erythema, tenderness or bleeding.      Right Adnexa: not tender and no mass present.    Left Adnexa: not tender and no mass present.    No cervical motion tenderness, friability or polyp.     Uterus is not enlarged or tender.  Breasts:     Right: No mass, nipple discharge, skin change or tenderness.     Left: Mass present. No nipple discharge, skin change or tenderness.    Neck:     Thyroid: No thyromegaly.  Cardiovascular:     Rate and Rhythm: Normal rate and regular rhythm.     Heart sounds: Normal heart sounds. No murmur heard.   Pulmonary:     Effort: Pulmonary effort is normal.     Breath sounds: Normal breath sounds.  Chest:    Abdominal:     Palpations: Abdomen is soft.     Tenderness: There is no abdominal tenderness. There is no guarding or rebound.  Musculoskeletal:        General: Normal range of motion.     Cervical back: Normal range of motion.  Lymphadenopathy:     Cervical: No cervical adenopathy.  Neurological:     General: No focal deficit present.     Mental Status: She is alert and oriented to person, place, and time.     Cranial Nerves: No cranial nerve deficit.  Skin:    General: Skin is warm and dry.  Psychiatric:        Mood and Affect: Mood normal.        Behavior: Behavior normal.         Thought Content: Thought content normal.        Judgment: Judgment normal.  Vitals and nursing note reviewed.     Assessment/Plan: Encounter for annual routine gynecological examination  Cervical cancer screening - Plan: Cytology - PAP  Screening for HPV (human papillomavirus) - Plan: Cytology - PAP  Encounter for surveillance of contraceptive pills - Plan: desogestrel-ethinyl estradiol (VELIVET) 0.1/0.125/0.15 -0.025 MG tablet; OCP RF. Restart with next menses, condoms for 1 wk. Offered to change to drospirenone OCP for acne but pt wants to stay on same Rx. F/u prn.   Encounter for screening mammogram for malignant neoplasm of breast - Plan: MM 3D  SCREEN BREAST BILATERAL; Encouraged pt to sched mammo, particularly with fibroadenoma. Benefits>risks of mammo radiation.  Fibroadenoma of left breast--stable per pt. Pt to sched mammo for f/u  Blood tests for routine general physical examination - Plan: Comprehensive metabolic panel, Lipid panel, Lipid panel, Comprehensive metabolic panel  Screening cholesterol level - Plan: Lipid panel, Lipid panel   Meds ordered this encounter  Medications  . desogestrel-ethinyl estradiol (VELIVET) 0.1/0.125/0.15 -0.025 MG tablet    Sig: Take 1 tablet by mouth daily.    Dispense:  84 tablet    Refill:  3    Order Specific Question:   Supervising Provider    Answer:   Gae Dry [[id number]]             GYN counsel breast self exam, adequate intake of calcium and vitamin D, diet and exercise     F/U  Return in about 1 year (around 08/05/2021).  Lakiah Dhingra B. Adrienne Delay, PA-C 08/05/2020 3:45 PM

## 2020-08-05 NOTE — Patient Instructions (Addendum)
I value your feedback and you entrusting us with your care. If you get a  patient survey, I would appreciate you taking the time to let us know about your experience today. Thank you!  [facility]: [phone]      

## 2020-08-07 LAB — CYTOLOGY - PAP
Comment: NEGATIVE
Diagnosis: NEGATIVE
High risk HPV: NEGATIVE

## 2020-08-19 ENCOUNTER — Ambulatory Visit
Admission: RE | Admit: 2020-08-19 | Discharge: 2020-08-19 | Disposition: A | Discharge status: Home / Self Care | Payer: No Typology Code available for payment source | Source: Ambulatory Visit | Attending: Obstetrics and Gynecology | Admitting: Obstetrics and Gynecology

## 2020-08-19 ENCOUNTER — Other Ambulatory Visit: Payer: Self-pay

## 2020-08-19 DIAGNOSIS — Z1231 Encounter for screening mammogram for malignant neoplasm of breast: Secondary | ICD-10-CM

## 2020-08-20 ENCOUNTER — Encounter: Payer: Self-pay | Admitting: Obstetrics and Gynecology

## 2020-12-11 DIAGNOSIS — H6503 Acute serous otitis media, bilateral: Secondary | ICD-10-CM | POA: Diagnosis not present

## 2020-12-15 DIAGNOSIS — H9209 Otalgia, unspecified ear: Secondary | ICD-10-CM | POA: Diagnosis not present

## 2021-04-16 ENCOUNTER — Other Ambulatory Visit: Payer: Self-pay | Admitting: Obstetrics and Gynecology

## 2021-04-16 DIAGNOSIS — Z3041 Encounter for surveillance of contraceptive pills: Secondary | ICD-10-CM

## 2021-05-27 ENCOUNTER — Telehealth: Payer: Self-pay

## 2021-05-27 NOTE — Telephone Encounter (Signed)
Pt left msg on triage requesting RF on her BC. When I called her back she said her pharmacy told her she had zero birth control left. Called her pharmacy and was advised sometime in October of 2022 she picked up one pack only of her birth control. So currently she only has 2 packs left.  ?

## 2021-07-23 ENCOUNTER — Other Ambulatory Visit: Payer: Self-pay | Admitting: Obstetrics and Gynecology

## 2021-07-23 DIAGNOSIS — Z3041 Encounter for surveillance of contraceptive pills: Secondary | ICD-10-CM

## 2021-08-09 ENCOUNTER — Ambulatory Visit: Payer: 59 | Admitting: Obstetrics and Gynecology

## 2021-08-16 NOTE — Progress Notes (Signed)
PCP:  Ashok Norris, MD   Chief Complaint  Patient presents with   Gynecologic Exam    Vag dryness, brain fog, hair loss x few months     HPI:      [name] is a 41 y.o. [id number] who LMP was Patient's last menstrual period was 07/05/2021 (approximate)., presents today for her annual examination.  Her menses are regular every 2-3 months with OCPs, lasting 3-4 days.  Dysmenorrhea mild, occurring first 1-2 days of flow. She does not have intermenstrual bleeding. Ran out of pills 6 wks ago. Menses heavier and with worse cramping off OCPs.   Sex activity: single partner, contraception - OCP (estrogen/progesterone). Starting to have vaginal dryness during sex, improved with lubricants.  Last Pap: 08/05/20 Results were: no abnormalities /neg HPV DNA  Hx of STDs: none  Last mammogram: 08/19/20 Results were normal, repeat in 12 months. 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 Yearly mammo recommended. Concerned about risk of radiation but plans to do it this yr. 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 There is no FH of breast cancer. There is no FH of ovarian cancer. The patient does do self-breast exams.  Tobacco use: The patient denies current or previous tobacco use. Alcohol use: social No drug use.  Exercise: mod active  She does get adequate calcium but not Vitamin D in her diet.  Has had issues with brain fog, hair loss for a few months. Did hormone testing with outside lab that showed low testosterone. Was given testosterone supp and did for 1 mo but didn't want to continue. No recent thyroid labs, not taking Fe supp, not doing hair supp. FH hair loss in her mom.   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 atypia/malignancy   CESAREAN SECTION  2010    Family History  Problem Relation Age of Onset   Depression Mother    Stroke Maternal Grandfather    Breast cancer Neg Hx     Social History   Socioeconomic History   Marital status: Married    Spouse name: Not on file   Number of children: 2   Years of education: 12   Highest education level: Not on file  Occupational History   Occupation: ADMINISTRATIVE ASSOCIATE    Comment: ASSISTANT  Tobacco Use   Smoking status: Never   Smokeless tobacco: Never  Vaping Use   Vaping Use: Never used  Substance and Sexual Activity   Alcohol use: Yes    Alcohol/week: 0.0 standard drinks of alcohol    Comment: OCC   Drug use: No   Sexual activity: Yes    Birth control/protection: None  Other Topics Concern   Not on file  Social History Narrative   Not on file   Social Determinants of Health   Financial Resource Strain: Not on file  Food Insecurity: Not on file  Transportation Needs: Not on file  Physical Activity: Not on file  Stress: Not on file  Social Connections: Not on file  Intimate Partner Violence: Not on file    Current Meds  Medication Sig   Multiple Vitamin (MULTI-VITAMINS) TABS Take  by mouth.     ROS:  Review of Systems  Constitutional:  Negative for fatigue, fever and unexpected weight change.  Respiratory:  Negative for cough, shortness of breath and wheezing.   Cardiovascular:  Negative for chest pain, palpitations and leg swelling.  Gastrointestinal:  Negative for blood in stool, constipation, diarrhea, nausea and vomiting.  Endocrine: Negative for cold intolerance, heat intolerance and polyuria.  Genitourinary:  Negative for dyspareunia, dysuria, flank pain, frequency, genital sores, hematuria, menstrual problem, pelvic pain, urgency, vaginal bleeding, vaginal discharge and vaginal pain.  Musculoskeletal:  Negative for back  pain, joint swelling and myalgias.  Skin:  Negative for rash.  Neurological:  Negative for dizziness, syncope, light-headedness, numbness and headaches.  Hematological:  Negative for adenopathy.  Psychiatric/Behavioral:  Negative for agitation, confusion, sleep disturbance and suicidal ideas. The patient is not nervous/anxious.      Objective: BP 90/60   Ht '5\' 3"'$  (1.6 m)   Wt 116 lb (52.6 kg)   LMP 07/05/2021 (Approximate)   BMI 20.55 kg/m    Physical Exam Constitutional:      General: She is not in acute distress.    Appearance: She is well-developed.  Genitourinary:     Vulva normal.     Right Labia: No rash, tenderness or lesions.    Left Labia: No tenderness, lesions or rash.    No vaginal discharge, erythema, tenderness or bleeding.      Right Adnexa: not tender and no mass present.    Left Adnexa: not tender and no mass present.    No cervical motion tenderness, friability or polyp.     Uterus is not enlarged or tender.  Breasts:    Right: No mass, nipple discharge, skin change or tenderness.     Left: Mass present. No nipple discharge, skin change or tenderness.  Neck:     Thyroid: No thyromegaly.  Cardiovascular:     Rate and Rhythm: Normal rate and regular rhythm.     Heart sounds: Normal heart sounds. No murmur heard. Pulmonary:     Effort: Pulmonary effort is normal.     Breath sounds: Normal breath sounds.  Chest:    Abdominal:     Palpations: Abdomen is soft.     Tenderness: There is no abdominal tenderness. There is no guarding or rebound.  Musculoskeletal:        General: Normal range of motion.     Cervical back: Normal range of motion.  Lymphadenopathy:     Cervical: No cervical adenopathy.  Neurological:     General: No focal deficit present.     Mental Status: She is alert and oriented to person, place, and time.     Cranial Nerves: No cranial nerve deficit.  Skin:    General: Skin is warm and dry.  Psychiatric:        Mood and Affect:  Mood normal.        Behavior: Behavior normal.        Thought Content: Thought content normal.        Judgment: Judgment normal.  Vitals and nursing note reviewed.     Assessment/Plan: Encounter for annual routine gynecological examination  Encounter for surveillance of contraceptive pills - Plan: desogestrel-ethinyl estradiol (VELIVET) 0.1/0.125/0.15 -0.025 MG tablet; OCP RF  Encounter for screening mammogram for malignant neoplasm of breast - Plan: MM 3D SCREEN BREAST BILATERAL; pt to schedule mammo  Hair loss - Plan: TSH + free T4, Ferritin; check labs. If normal, most likely genetic. Add  Fe supp and hair/nail supp.   Thyroid disorder screening - Plan: TSH + free T4  Screening for deficiency anemia - Plan: Ferritin    Meds ordered this encounter  Medications   desogestrel-ethinyl estradiol (VELIVET) 0.1/0.125/0.15 -0.025 MG tablet    Sig: Take 1 tablet by mouth daily.    Dispense:  84 tablet    Refill:  3    Order Specific Question:   Supervising Provider    Answer:   CONSTANT, PEGGY [[id number]]             GYN counsel breast self exam, adequate intake of calcium and vitamin D, diet and exercise     F/U  Return in about 1 year (around 08/18/2022).  Kortne All B. Avnoor Koury, PA-C 08/17/2021 4:16 PM

## 2021-08-17 ENCOUNTER — Encounter: Payer: Self-pay | Admitting: Obstetrics and Gynecology

## 2021-08-17 ENCOUNTER — Ambulatory Visit (INDEPENDENT_AMBULATORY_CARE_PROVIDER_SITE_OTHER): Payer: Managed Care, Other (non HMO) | Admitting: Obstetrics and Gynecology

## 2021-08-17 VITALS — BP 90/60 | Ht 63.0 in | Wt 116.0 lb

## 2021-08-17 DIAGNOSIS — L659 Nonscarring hair loss, unspecified: Secondary | ICD-10-CM | POA: Diagnosis not present

## 2021-08-17 DIAGNOSIS — Z130 Encounter for screening for diseases of the blood and blood-forming organs and certain disorders involving the immune mechanism: Secondary | ICD-10-CM

## 2021-08-17 DIAGNOSIS — Z01419 Encounter for gynecological examination (general) (routine) without abnormal findings: Secondary | ICD-10-CM | POA: Diagnosis not present

## 2021-08-17 DIAGNOSIS — Z1329 Encounter for screening for other suspected endocrine disorder: Secondary | ICD-10-CM

## 2021-08-17 DIAGNOSIS — Z3041 Encounter for surveillance of contraceptive pills: Secondary | ICD-10-CM

## 2021-08-17 DIAGNOSIS — Z0000 Encounter for general adult medical examination without abnormal findings: Secondary | ICD-10-CM

## 2021-08-17 DIAGNOSIS — Z1231 Encounter for screening mammogram for malignant neoplasm of breast: Secondary | ICD-10-CM

## 2021-08-17 MED ORDER — VELIVET 0.1/0.125/0.15 -0.025 MG PO TABS: 1.0000 | ORAL_TABLET | Freq: Every day | ORAL | 3 refills | Status: DC

## 2021-08-17 NOTE — Patient Instructions (Addendum)
I value your feedback and you entrusting us with your care. If you get a [facility] patient survey, I would appreciate you taking the time to let us know about your experience today. Thank you!  [facility]: [phone]      

## 2021-08-18 LAB — TSH+FREE T4
Free T4: 1.32 ng/dL (ref 0.82–1.77)
TSH: 0.579 u[IU]/mL (ref 0.450–4.500)

## 2021-08-18 LAB — FERRITIN: Ferritin: 64 ng/mL (ref 15–150)

## 2021-08-26 ENCOUNTER — Ambulatory Visit
Admission: RE | Admit: 2021-08-26 | Discharge: 2021-08-26 | Disposition: A | Discharge status: Home / Self Care | Payer: Managed Care, Other (non HMO) | Source: Ambulatory Visit | Attending: Obstetrics and Gynecology | Admitting: Obstetrics and Gynecology

## 2021-08-26 DIAGNOSIS — Z1231 Encounter for screening mammogram for malignant neoplasm of breast: Secondary | ICD-10-CM | POA: Diagnosis present

## 2022-05-11 ENCOUNTER — Ambulatory Visit: Payer: Managed Care, Other (non HMO) | Admitting: Dermatology

## 2022-05-11 ENCOUNTER — Encounter: Payer: Self-pay | Admitting: Dermatology

## 2022-05-11 VITALS — BP 109/69 | HR 87

## 2022-05-11 DIAGNOSIS — B078 Other viral warts: Secondary | ICD-10-CM | POA: Diagnosis not present

## 2022-05-11 DIAGNOSIS — L649 Androgenic alopecia, unspecified: Secondary | ICD-10-CM | POA: Diagnosis not present

## 2022-05-11 MED ORDER — MINOXIDIL 2.5 MG PO TABS: 2.5000 mg | ORAL_TABLET | Freq: Every day | ORAL | 3 refills | Status: DC

## 2022-05-11 MED ORDER — FINASTERIDE 5 MG PO TABS: 5.0000 mg | ORAL_TABLET | Freq: Every day | ORAL | 3 refills | Status: DC

## 2022-05-11 NOTE — Patient Instructions (Addendum)
Start Finasteride 5 mg 1/2 tablet once daily.   Start Minoxidil 2.5 mg start at 1/2 tablet once daily. After 1 month can increase to 1 tablet daily if tolerating.   Doses of minoxidil for hair loss are considered 'low dose'. This is because the doses used for hair loss are much lower than the doses which are used for conditions such as high blood pressure (hypertension). The doses used for hypertension are 10-'40mg'$  per day.  Side effects are uncommon at the low doses (up to 2.5 mg/day) used to treat hair loss. Potential side effects, more commonly seen at higher doses, include: Increase in hair growth (hypertrichosis) elsewhere on face and body Temporary hair shedding upon starting medication which may last up to 4 weeks Ankle swelling, fluid retention, rapid weight gain more than 5 pounds Low blood pressure and feeling lightheaded or dizzy when standing up quickly Fast or irregular heartbeat Headaches   Cryotherapy Aftercare  Wash gently with soap and water everyday.   Apply Vaseline and Band-Aid daily until healed.    Due to recent changes in healthcare laws, you may see results of your pathology and/or laboratory studies on MyChart before the doctors have had a chance to review them. We understand that in some cases there may be results that are confusing or concerning to you. Please understand that not all results are received at the same time and often the doctors may need to interpret multiple results in order to provide you with the best plan of care or course of treatment. Therefore, we ask that you please give [place] 2 business days to thoroughly review all your results before contacting the office for clarification. Should we see a critical lab result, you will be contacted sooner.   If You Need Anything After Your Visit  If you have any questions or concerns for your doctor, please call our main line at [phone] and press option 4 to reach your doctor's medical assistant. If no one  answers, please leave a voicemail as directed and we will return your call as soon as possible. Messages left after 4 pm will be answered the following business day.   You may also send [place] a message via [place]. We typically respond to MyChart messages within 1-2 business days.  For prescription refills, please ask your pharmacy to contact our office. Our fax number is [phone].  If you have an urgent issue when the clinic is closed that cannot wait until the next business day, you can page your doctor at the number below.    Please note that while we do our best to be available for urgent issues outside of office hours, we are not available 24/7.   If you have an urgent issue and are unable to reach [place], you may choose to seek medical care at your doctor's office, retail clinic, urgent care center, or emergency room.  If you have a medical emergency, please immediately call 911 or go to the emergency department.  Pager Numbers  - Dr. Nehemiah Massed: [phone]  - Dr. Laurence Ferrari: [phone]  - Dr. Nicole Kindred: [phone]  In the event of inclement weather, please call our main line at [phone] for an update on the status of any delays or closures.  Dermatology Medication Tips: Please keep the boxes that topical medications come in in order to help keep track of the instructions about where and how to use these. Pharmacies typically print the medication instructions only on the boxes and not directly on the medication  tubes.   If your medication is too expensive, please contact our office at [phone] option 4 or send [place] a message through [place].   We are unable to tell what your co-pay for medications will be in advance as this is different depending on your insurance coverage. However, we may be able to find a substitute medication at lower cost or fill out paperwork to get insurance to cover a needed medication.   If a prior authorization is required to get your medication covered  by your insurance company, please allow [place] 1-2 business days to complete this process.  Drug prices often vary depending on where the prescription is filled and some pharmacies may offer cheaper prices.  The website www.goodrx.com contains coupons for medications through different pharmacies. The prices here do not account for what the cost may be with help from insurance (it may be cheaper with your insurance), but the website can give you the price if you did not use any insurance.  - You can print the associated coupon and take it with your prescription to the pharmacy.  - You may also stop by our office during regular business hours and pick up a GoodRx coupon card.  - If you need your prescription sent electronically to a different pharmacy, notify our office through [facility] or by phone at [phone] option 4.     Si Usted Necesita Algo Despus de Su Visita  Tambin puede enviarnos un mensaje a travs de Pharmacist, community. Por lo general respondemos a los mensajes de MyChart en el transcurso de 1 a 2 das hbiles.  Para renovar recetas, por favor pida a su farmacia que se ponga en contacto con nuestra oficina. Harland Dingwall de fax es [place] [phone].  Si tiene un asunto urgente cuando la clnica est cerrada y que no puede esperar hasta el siguiente da hbil, puede llamar/localizar a su doctor(a) al nmero que aparece a continuacin.   Por favor, tenga en cuenta que aunque hacemos todo lo posible para estar disponibles para asuntos urgentes fuera del horario de [place], no estamos disponibles las 24 horas del da, los 7 das de la [place].   Si tiene un problema urgente y no puede comunicarse con nosotros, puede optar por buscar atencin mdica  en el consultorio de su doctor(a), en una clnica privada, en un centro de atencin urgente o en una sala de emergencias.  Si tiene Engineering geologist, por favor llame inmediatamente al 911 o vaya a la sala de emergencias.  Nmeros de  bper  - Dr. Nehemiah Massed: [phone]  - Dra. Moye: [phone]  - Dra. Nicole Kindred: [phone]  En caso de inclemencias del [place], por favor llame a Johnsie Kindred principal al [phone] para una actualizacin sobre el [place] de cualquier retraso o cierre.  Consejos para la medicacin en dermatologa: Por favor, guarde las cajas en las que vienen los medicamentos de uso tpico para ayudarle a seguir las instrucciones sobre dnde y cmo usarlos. Las farmacias generalmente imprimen las instrucciones del medicamento slo en las cajas y no directamente en los tubos del [place].   Si su medicamento es muy caro, por favor, pngase en contacto con Zigmund Daniel llamando al [phone] y presione la opcin 4 o envenos un mensaje a travs de Pharmacist, community.   No podemos decirle cul ser su copago por los medicamentos por adelantado ya que esto es diferente dependiendo de la cobertura de su seguro. Sin embargo, es posible que podamos encontrar un medicamento sustituto a Scientist, water quality  o llenar un formulario para que el seguro cubra el medicamento que se considera necesario.   Si se requiere una autorizacin previa para que su compaa de seguros [place] su medicamento, por favor permtanos de 1 a 2 das hbiles para completar este proceso.  Los precios de los medicamentos varan con frecuencia dependiendo del Environmental consultant de dnde se surte la receta y alguna farmacias pueden ofrecer precios ms baratos.  El sitio web www.goodrx.com tiene cupones para medicamentos de Airline pilot. Los precios aqu no tienen en cuenta lo que podra costar con la ayuda del seguro (puede ser ms barato con su seguro), pero el sitio web puede darle el precio si no utiliz Research scientist (physical sciences).  - Puede imprimir el cupn correspondiente y llevarlo con su receta a la farmacia.  - Tambin puede pasar por nuestra oficina durante el horario de atencin regular y Charity fundraiser una tarjeta de cupones de GoodRx.  - Si necesita que su receta se  enve electrnicamente a una farmacia diferente, informe a nuestra oficina a travs de MyChart de [place] o por telfono llamando al [phone] y presione la opcin 4.

## 2022-05-11 NOTE — Progress Notes (Signed)
New Patient Visit  Subjective  Christina Davis is a 42 y.o. female who presents for the following: Alopecia (Would like to discuss oral treatment for hair loss. States mother has severe hair loss) and Skin Problem (Check spot at right index finger and thumb. Raised. Dur: 6 months).  Also toe has wart.  No personal or family Hx of breast cancer.   Maternal Grandfather also had hair loss.  The patient has spots, moles and lesions to be evaluated, some may be new or changing and the patient has concerns that these could be cancer.   Review of Systems: No other skin or systemic complaints except as noted in HPI or Assessment and Plan.   Objective  Well appearing patient in no apparent distress; mood and affect are within normal limits.  A focused examination was performed including scalp, face, hands. Relevant physical exam findings are noted in the Assessment and Plan.  Scalp Thinning at B/L temporal hair line/scalp. Diffuse thinning of the crown and widening of the midline part with retention of the frontal hairline - Reviewed progressive nature and prognosis.          Right Proximal Thumb x1, R index finger x1, R-2 plantar toe x1 (3) Verrucous papules -- Discussed viral etiology and contagion. 3 mm firm depressed papule at R-2 toe.   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 Condition is symptomatic/ bothersome to patient. Not currently at goal.  Female Androgenic Alopecia is a chronic condition related to genetics and/or hormonal changes.  In women androgenetic alopecia is commonly associated with menopause but may occur any time after puberty.  It causes hair thinning primarily on the crown with widening of the part and temporal hairline recession.  Can use OTC Rogaine (minoxidil) 5% solution/foam as directed.  Oral treatments in female patients who have no contraindication may include  : - Low dose oral minoxidil 1.25 - '5mg'$  daily - Spironolactone 50 - '100mg'$  bid - Finasteride 2.5 - 5 mg daily Adjunctive therapies include: - Low Level Laser Light Therapy (LLLT) - Platelet-rich plasma injections (PRP) - Hair Transplants or scalp reduction   Reviewed labs from 08/17/2021. Thyroid and Ferritin are WNL.  Start Finasteride 5 mg 1/2 tablet once daily.   Start Minoxidil 2.5 mg start at 1/2 tablet once daily. Increase to 1 tablet daily if tolerating.   Doses of minoxidil for hair loss are considered 'low dose'. This is because the doses used for hair loss are much lower than the doses which are used for conditions such as high blood pressure (hypertension). The doses used for hypertension are 10-'40mg'$  per day.  Side effects are uncommon at the low doses (up to 2.5 mg/day) used to treat hair loss. Potential side effects, more commonly seen at higher doses, include: Increase in hair growth (hypertrichosis) elsewhere on face and body Temporary hair shedding upon starting medication which may last up to 4 weeks Ankle swelling, fluid retention, rapid weight gain more than 5 pounds Low blood pressure and feeling lightheaded or dizzy when standing up quickly Fast or irregular heartbeat Headaches      minoxidil (LONITEN) 2.5 MG tablet - Scalp Take 1 tablet (2.5 mg total) by mouth daily.  finasteride (PROSCAR) 5 MG tablet - Scalp Take 1 tablet (5 mg total) by mouth daily.  Other viral warts (3) Right Proximal Thumb x1, R index finger x1, R-2 plantar toe x1  Viral Wart (HPV) Counseling  Discussed viral / HPV (Human Papilloma Virus) etiology and risk of spread /infectivity to other areas of body as well as to other people.  Multiple treatments and methods may be required to clear warts and it is possible treatment may not be successful.  Treatment risks include discoloration; scarring and there is still potential for wart recurrence.  Can try OTC wart treatments between visits.    Destruction of lesion - Right Proximal Thumb x1, R index finger x1, R-2 plantar toe x1  Destruction method: cryotherapy   Informed consent: discussed and consent obtained   Lesion destroyed using liquid nitrogen: Yes   Region frozen until ice ball extended beyond lesion: Yes   Outcome: patient tolerated procedure well with no complications   Post-procedure details: wound care instructions given   Additional details:  Prior to procedure, discussed risks of blister formation, small wound, skin dyspigmentation, or rare scar following cryotherapy. Recommend Vaseline ointment to treated areas while healing.    Return in about 3 months (around 08/11/2022) for Alopecia and wart Follow Up.  I, Emelia Salisbury, CMA, am acting as scribe for Brendolyn Patty, MD.  Documentation: I have reviewed the above documentation for accuracy and completeness, and I agree with the above.  Brendolyn Patty MD

## 2022-07-23 ENCOUNTER — Other Ambulatory Visit: Payer: Self-pay | Admitting: Obstetrics and Gynecology

## 2022-07-23 DIAGNOSIS — Z3041 Encounter for surveillance of contraceptive pills: Secondary | ICD-10-CM

## 2022-08-16 ENCOUNTER — Ambulatory Visit: Payer: Managed Care, Other (non HMO) | Admitting: Dermatology

## 2022-08-22 ENCOUNTER — Other Ambulatory Visit: Payer: Self-pay

## 2022-08-22 DIAGNOSIS — Z3041 Encounter for surveillance of contraceptive pills: Secondary | ICD-10-CM

## 2022-08-22 MED ORDER — VELIVET 0.1/0.125/0.15 -0.025 MG PO TABS
1.0000 | ORAL_TABLET | Freq: Every day | ORAL | 0 refills | Status: DC
Start: 2022-08-22 — End: 2022-11-14

## 2022-10-16 NOTE — Progress Notes (Deleted)
PCP:  Dennison Mascot, MD   No chief complaint on file.    HPI:      [name] is a 42 y.o. [id number] who LMP was No LMP recorded. (Menstrual status: Irregular Periods)., presents today for her annual examination.  Her menses are regular every 2-3 months with OCPs, lasting 3-4 days.  Dysmenorrhea mild, occurring first 1-2 days of flow. She does not have intermenstrual bleeding. Ran out of pills 6 wks ago. Menses heavier and with worse cramping off OCPs.   Sex activity: single partner, contraception - OCP (estrogen/progesterone). Starting to have vaginal dryness during sex, improved with lubricants.  Last Pap: 08/05/20 Results were: no abnormalities /neg HPV DNA  Hx of STDs: none  Last mammogram: 08/26/21 Results were normal, repeat in 12 months. 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 Yearly mammo recommended. Concerned about risk of radiation but plans to do it this yr. 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 There is no FH of breast cancer. There is no FH of ovarian cancer. The patient does do self-breast exams.  Tobacco use: The patient denies current or previous tobacco use. Alcohol use: social No drug use.  Exercise: mod active  She does get adequate calcium but not Vitamin D in her diet.  Has had issues with brain fog, hair loss for a few months. Did hormone testing with outside lab that showed low testosterone. Was given testosterone supp and did for 1 mo but didn't want to continue. No recent thyroid labs, not taking Fe supp, not doing hair supp. FH hair loss in her mom.   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   CESAREAN SECTION  2010    Family History  Problem Relation Age of Onset   Depression Mother    Stroke Maternal Grandfather    Breast cancer Neg Hx     Social History   Socioeconomic History   Marital status: Married    Spouse name: Not on file   Number of children: 2   Years of education: 12   Highest education level: Not on file  Occupational History   Occupation: ADMINISTRATIVE ASSOCIATE    Comment: ASSISTANT  Tobacco Use   Smoking status: Never   Smokeless tobacco: Never  Vaping Use   Vaping status: Never Used  Substance and Sexual Activity   Alcohol use: Yes    Alcohol/week: 0.0 standard drinks of alcohol    Comment: OCC   Drug use: No   Sexual activity: Yes    Birth control/protection: None  Other Topics Concern   Not on file  Social History Narrative   Not on file   Social Determinants of Health   Financial Resource Strain: Not on file  Food Insecurity: Not on file  Transportation Needs: Not on file  Physical Activity: Not on file  Stress: Not on file  Social Connections: Not on file  Intimate Partner Violence: Not on file    No outpatient medications have been marked as taking for the 10/17/22 encounter (Appointment) with , Ilona Sorrel, PA-C.     ROS:  Review of Systems  Constitutional:  Negative for fatigue, fever and unexpected weight change.  Respiratory:  Negative for cough, shortness of breath and wheezing.   Cardiovascular:  Negative for chest pain, palpitations and leg swelling.  Gastrointestinal:  Negative for blood in stool, constipation, diarrhea, nausea and vomiting.  Endocrine: Negative for cold intolerance, heat intolerance and polyuria.  Genitourinary:  Negative for dyspareunia, dysuria, flank pain, frequency, genital sores, hematuria, menstrual problem, pelvic pain, urgency, vaginal bleeding, vaginal discharge and vaginal pain.  Musculoskeletal:  Negative for back pain, joint swelling and myalgias.  Skin:   Negative for rash.  Neurological:  Negative for dizziness, syncope, light-headedness, numbness and headaches.  Hematological:  Negative for adenopathy.  Psychiatric/Behavioral:  Negative for agitation, confusion, sleep disturbance and suicidal ideas. The patient is not nervous/anxious.      Objective: There were no vitals taken for this visit.   Physical Exam Constitutional:      General: She is not in acute distress.    Appearance: She is well-developed.  Genitourinary:     Vulva normal.     Right Labia: No rash, tenderness or lesions.    Left Labia: No tenderness, lesions or rash.    No vaginal discharge, erythema, tenderness or bleeding.      Right Adnexa: not tender and no mass present.    Left Adnexa: not tender and no mass present.    No cervical motion tenderness, friability or polyp.     Uterus is not enlarged or tender.  Breasts:    Right: No mass, nipple discharge, skin change or tenderness.     Left: Mass present. No nipple discharge, skin change or tenderness.  Neck:     Thyroid: No thyromegaly.  Cardiovascular:     Rate and Rhythm: Normal rate and regular rhythm.     Heart sounds: Normal heart sounds. No murmur heard. Pulmonary:     Effort: Pulmonary effort is normal.     Breath sounds: Normal breath sounds.  Chest:    Abdominal:     Palpations: Abdomen is soft.     Tenderness: There is no abdominal tenderness. There is no guarding or rebound.  Musculoskeletal:        General: Normal range of motion.     Cervical back: Normal range of motion.  Lymphadenopathy:     Cervical: No cervical adenopathy.  Neurological:     General: No focal deficit present.     Mental Status: She is alert and oriented to person, place, and time.     Cranial Nerves: No cranial nerve deficit.  Skin:    General: Skin is warm and dry.  Psychiatric:        Mood and Affect: Mood normal.        Behavior: Behavior normal.        Thought Content: Thought content normal.         Judgment: Judgment normal.  Vitals and nursing note reviewed.     Assessment/Plan: Encounter for annual routine gynecological examination  Encounter for surveillance of contraceptive pills - Plan: desogestrel-ethinyl estradiol (VELIVET) 0.1/0.125/0.15 -0.025 MG tablet; OCP RF  Encounter for screening mammogram for malignant neoplasm of breast - Plan: MM 3D SCREEN BREAST BILATERAL; pt to schedule mammo  Hair loss - Plan: TSH + free T4, Ferritin; check labs. If normal, most likely genetic. Add Fe supp and hair/nail supp.   Thyroid disorder screening - Plan: TSH + free T4  Screening for deficiency anemia - Plan: Ferritin    No orders of the defined types  were placed in this encounter.            GYN counsel breast self exam, adequate intake of calcium and vitamin D, diet and exercise     F/U  No follow-ups on file.   B. , PA-C 10/16/2022 9:18 PM

## 2022-10-17 ENCOUNTER — Ambulatory Visit: Payer: Managed Care, Other (non HMO) | Admitting: Obstetrics and Gynecology

## 2022-10-17 DIAGNOSIS — Z1231 Encounter for screening mammogram for malignant neoplasm of breast: Secondary | ICD-10-CM

## 2022-10-17 DIAGNOSIS — Z01419 Encounter for gynecological examination (general) (routine) without abnormal findings: Secondary | ICD-10-CM

## 2022-10-17 DIAGNOSIS — Z3041 Encounter for surveillance of contraceptive pills: Secondary | ICD-10-CM

## 2022-11-12 ENCOUNTER — Other Ambulatory Visit: Payer: Self-pay | Admitting: Obstetrics and Gynecology

## 2022-11-12 DIAGNOSIS — Z3041 Encounter for surveillance of contraceptive pills: Secondary | ICD-10-CM

## 2022-11-23 ENCOUNTER — Other Ambulatory Visit: Payer: Self-pay

## 2022-11-23 DIAGNOSIS — Z3041 Encounter for surveillance of contraceptive pills: Secondary | ICD-10-CM

## 2022-11-23 MED ORDER — VELIVET 0.1/0.125/0.15 -0.025 MG PO TABS
1.0000 | ORAL_TABLET | Freq: Every day | ORAL | 0 refills | Status: AC
Start: 2022-11-23 — End: ?

## 2022-12-07 NOTE — Progress Notes (Unsigned)
PCP:  Dennison Mascot, MD   No chief complaint on file.    HPI:      [name] is a 42 y.o. [id number] who LMP was No LMP recorded. (Menstrual status: Irregular Periods)., presents today for her annual examination.  Her menses are regular every 2-3 months with OCPs, lasting 3-4 days.  Dysmenorrhea mild, occurring first 1-2 days of flow. She does not have intermenstrual bleeding. Ran out of pills 6 wks ago. Menses heavier and with worse cramping off OCPs.   Sex activity: single partner, contraception - OCP (estrogen/progesterone). Starting to have vaginal dryness during sex, improved with lubricants.  Last Pap: 08/05/20 Results were: no abnormalities /neg HPV DNA  Hx of STDs: none  Last mammogram: 08/26/21 Results were normal, repeat in 12 months. 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 Yearly mammo recommended. Concerned about risk of radiation but plans to do it this yr. 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 There is no FH of breast cancer. There is no FH of ovarian cancer. The patient does do self-breast exams.  Tobacco use: The patient denies current or previous tobacco use. Alcohol use: social No drug use.  Exercise: mod active  She does get adequate calcium but not Vitamin D in her diet.  Has had issues with brain fog, hair loss for a few months. Did hormone testing with outside lab that showed low testosterone. Was given testosterone supp and did for 1 mo but didn't want to continue. No recent thyroid labs, not taking Fe supp, not doing hair supp. FH hair loss in her mom.   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   CESAREAN SECTION  2010    Family History  Problem Relation Age of Onset   Depression Mother    Stroke Maternal Grandfather    Breast cancer Neg Hx     Social History   Socioeconomic History   Marital status: Married    Spouse name: Not on file   Number of children: 2   Years of education: 12   Highest education level: Not on file  Occupational History   Occupation: ADMINISTRATIVE ASSOCIATE    Comment: ASSISTANT  Tobacco Use   Smoking status: Never   Smokeless tobacco: Never  Vaping Use   Vaping status: Never Used  Substance and Sexual Activity   Alcohol use: Yes    Alcohol/week: 0.0 standard drinks of alcohol    Comment: OCC   Drug use: No   Sexual activity: Yes    Birth control/protection: None  Other Topics Concern   Not on file  Social History Narrative   Not on file   Social Determinants of Health   Financial Resource Strain: Not on file  Food Insecurity: Not on file  Transportation Needs: Not on file  Physical Activity: Not on file  Stress: Not on file  Social Connections: Not on file  Intimate Partner Violence: Not on file    No outpatient medications have been marked as taking for the 12/08/22 encounter (Appointment) with Christina Davis, Christina Sorrel, PA-C.     ROS:  Review of Systems  Constitutional:  Negative for fatigue, fever and unexpected weight change.  Respiratory:  Negative for cough, shortness of breath and wheezing.   Cardiovascular:  Negative for chest pain, palpitations and leg swelling.  Gastrointestinal:  Negative for blood in stool, constipation, diarrhea, nausea and vomiting.  Endocrine: Negative for cold intolerance, heat intolerance and polyuria.  Genitourinary:  Negative for dyspareunia, dysuria, flank pain, frequency, genital sores, hematuria, menstrual problem, pelvic pain, urgency, vaginal bleeding, vaginal discharge and vaginal pain.  Musculoskeletal:  Negative for back pain, joint swelling and myalgias.  Skin:   Negative for rash.  Neurological:  Negative for dizziness, syncope, light-headedness, numbness and headaches.  Hematological:  Negative for adenopathy.  Psychiatric/Behavioral:  Negative for agitation, confusion, sleep disturbance and suicidal ideas. The patient is not nervous/anxious.      Objective: There were no vitals taken for this visit.   Physical Exam Constitutional:      General: She is not in acute distress.    Appearance: She is well-developed.  Genitourinary:     Vulva normal.     Right Labia: No rash, tenderness or lesions.    Left Labia: No tenderness, lesions or rash.    No vaginal discharge, erythema, tenderness or bleeding.      Right Adnexa: not tender and no mass present.    Left Adnexa: not tender and no mass present.    No cervical motion tenderness, friability or polyp.     Uterus is not enlarged or tender.  Breasts:    Right: No mass, nipple discharge, skin change or tenderness.     Left: Mass present. No nipple discharge, skin change or tenderness.  Neck:     Thyroid: No thyromegaly.  Cardiovascular:     Rate and Rhythm: Normal rate and regular rhythm.     Heart sounds: Normal heart sounds. No murmur heard. Pulmonary:     Effort: Pulmonary effort is normal.     Breath sounds: Normal breath sounds.  Chest:    Abdominal:     Palpations: Abdomen is soft.     Tenderness: There is no abdominal tenderness. There is no guarding or rebound.  Musculoskeletal:        General: Normal range of motion.     Cervical back: Normal range of motion.  Lymphadenopathy:     Cervical: No cervical adenopathy.  Neurological:     General: No focal deficit present.     Mental Status: She is alert and oriented to person, place, and time.     Cranial Nerves: No cranial nerve deficit.  Skin:    General: Skin is warm and dry.  Psychiatric:        Mood and Affect: Mood normal.        Behavior: Behavior normal.        Thought Content: Thought content normal.         Judgment: Judgment normal.  Vitals and nursing note reviewed.     Assessment/Plan: Encounter for annual routine gynecological examination  Encounter for surveillance of contraceptive pills - Plan: desogestrel-ethinyl estradiol (VELIVET) 0.1/0.125/0.15 -0.025 MG tablet; OCP RF  Encounter for screening mammogram for malignant neoplasm of breast - Plan: MM 3D SCREEN BREAST BILATERAL; pt to schedule mammo  Hair loss - Plan: TSH + free T4, Ferritin; check labs. If normal, most likely genetic. Add Fe supp and hair/nail supp.   Thyroid disorder screening - Plan: TSH + free T4  Screening for deficiency anemia - Plan: Ferritin    No orders of the defined types  were placed in this encounter.            GYN counsel breast self exam, adequate intake of calcium and vitamin D, diet and exercise     F/U  No follow-ups on file.  Fletcher Ostermiller B. Kristalyn Bergstresser, PA-C 12/07/2022 3:17 PM

## 2022-12-08 ENCOUNTER — Encounter: Payer: Self-pay | Admitting: Obstetrics and Gynecology

## 2022-12-08 ENCOUNTER — Ambulatory Visit (INDEPENDENT_AMBULATORY_CARE_PROVIDER_SITE_OTHER): Payer: Managed Care, Other (non HMO) | Admitting: Obstetrics and Gynecology

## 2022-12-08 VITALS — BP 115/72 | HR 80 | Ht 63.0 in | Wt 122.0 lb

## 2022-12-08 DIAGNOSIS — Z3041 Encounter for surveillance of contraceptive pills: Secondary | ICD-10-CM

## 2022-12-08 DIAGNOSIS — Z01419 Encounter for gynecological examination (general) (routine) without abnormal findings: Secondary | ICD-10-CM

## 2022-12-08 DIAGNOSIS — Z1231 Encounter for screening mammogram for malignant neoplasm of breast: Secondary | ICD-10-CM

## 2022-12-08 MED ORDER — VELIVET 0.1/0.125/0.15 -0.025 MG PO TABS
1.0000 | ORAL_TABLET | Freq: Every day | ORAL | 3 refills | Status: AC
Start: 2022-12-08 — End: ?

## 2022-12-08 NOTE — Patient Instructions (Addendum)
I value your feedback and you entrusting us with your care. If you get a [facility] patient survey, I would appreciate you taking the time to let us know about your experience today. Thank you!  [facility] ([place])--[phone]  

## 2023-07-14 ENCOUNTER — Ambulatory Visit
Admission: RE | Admit: 2023-07-14 | Discharge: 2023-07-14 | Disposition: A | Discharge status: Home / Self Care | Source: Ambulatory Visit | Attending: Obstetrics and Gynecology | Admitting: Obstetrics and Gynecology

## 2023-07-14 DIAGNOSIS — Z1231 Encounter for screening mammogram for malignant neoplasm of breast: Secondary | ICD-10-CM

## 2023-07-19 ENCOUNTER — Ambulatory Visit: Payer: Self-pay | Admitting: Obstetrics and Gynecology

## 2024-01-14 ENCOUNTER — Other Ambulatory Visit: Payer: Self-pay | Admitting: Obstetrics and Gynecology

## 2024-01-14 DIAGNOSIS — Z3041 Encounter for surveillance of contraceptive pills: Secondary | ICD-10-CM

## 2024-01-18 IMAGING — MG MM DIGITAL SCREENING BILAT W/ TOMO AND CAD
8 series · 9 of 24 positions shown · non-contrast
Comparison: Previous exam(s).

CLINICAL DATA: Screening.

EXAM:
DIGITAL SCREENING BILATERAL MAMMOGRAM WITH TOMOSYNTHESIS AND CAD
TECHNIQUE: Bilateral screening digital craniocaudal and mediolateral oblique
mammograms were obtained. Bilateral screening digital breast
tomosynthesis was performed. The images were evaluated with
computer-aided detection.

[L CC synth-2D]
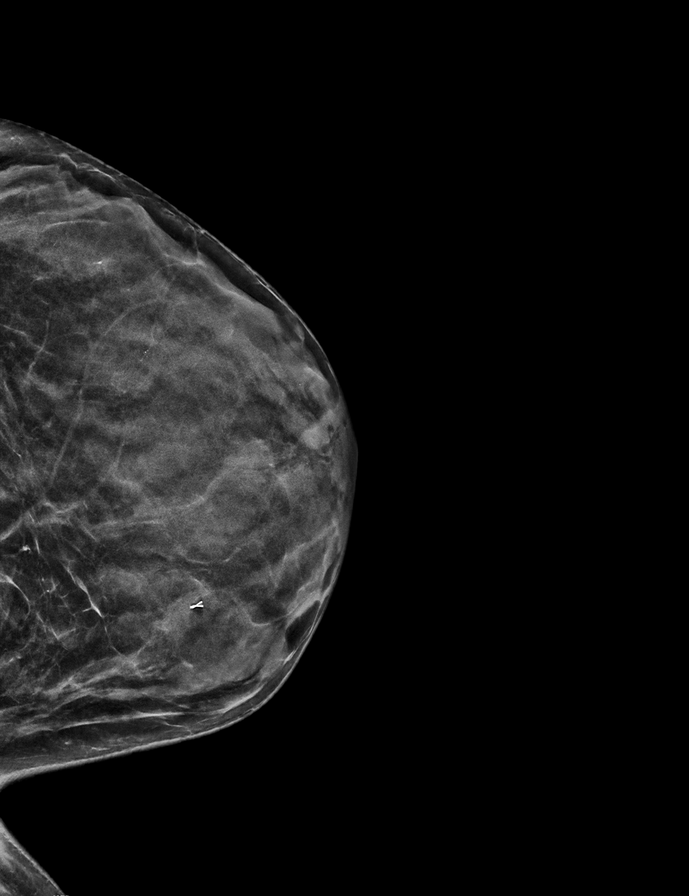

[R MLO synth-2D]
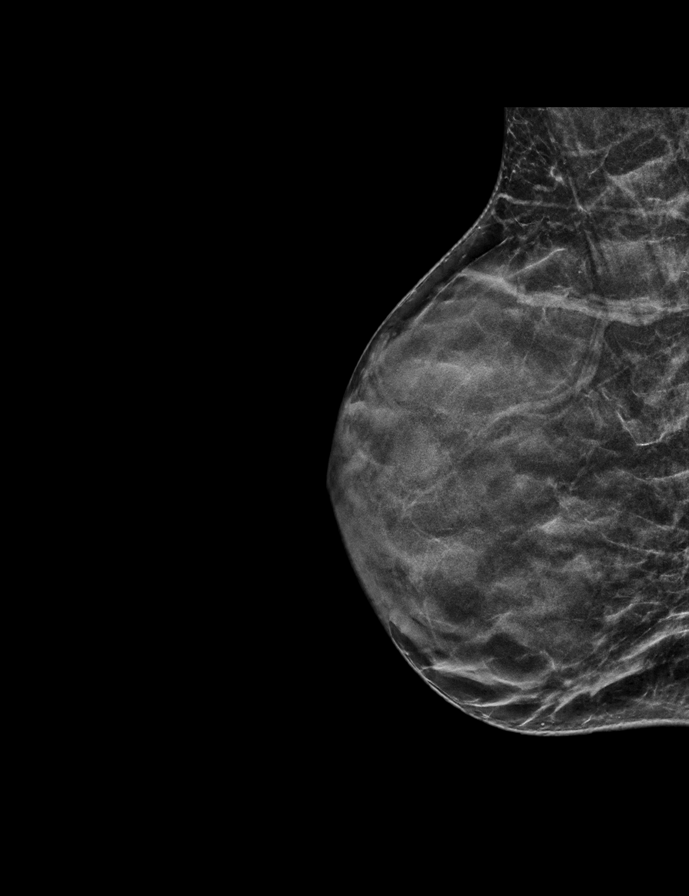

[L MLO synth-2D]
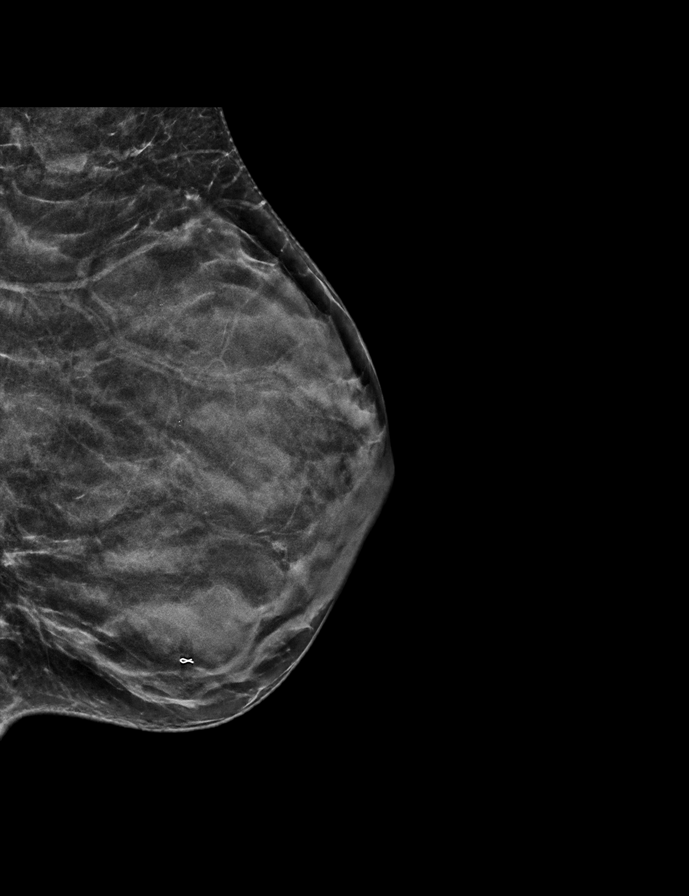

[R CC synth-2D]
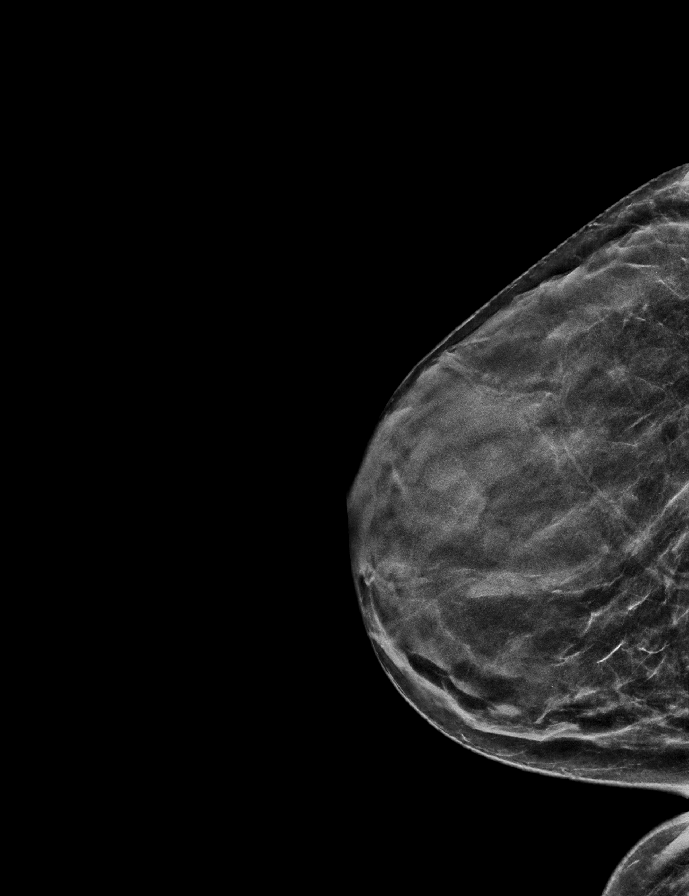

[R CC tomo · 2 of 47 frames shown]
[frame 16/47]
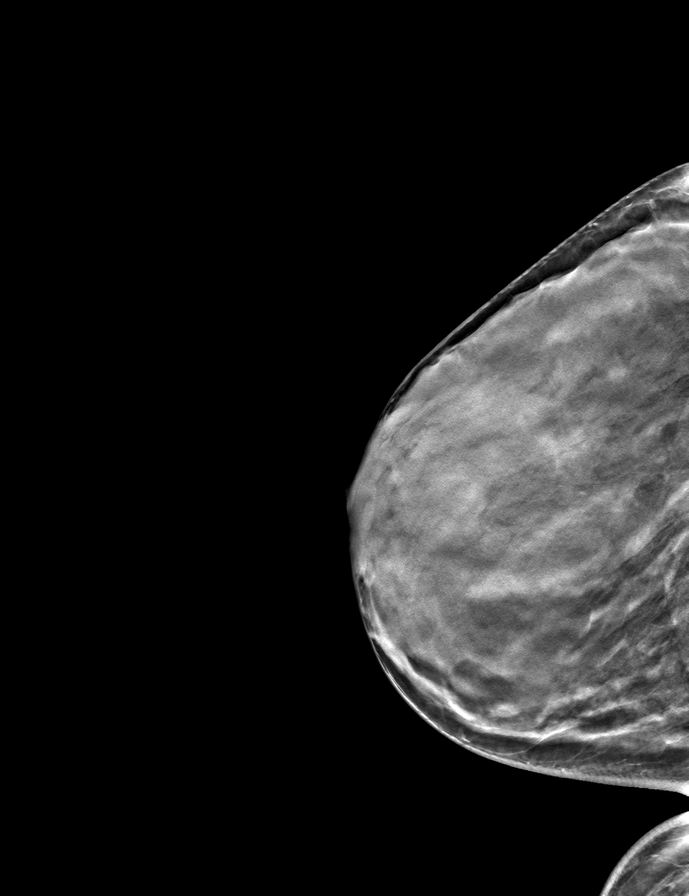
[frame 24/47]
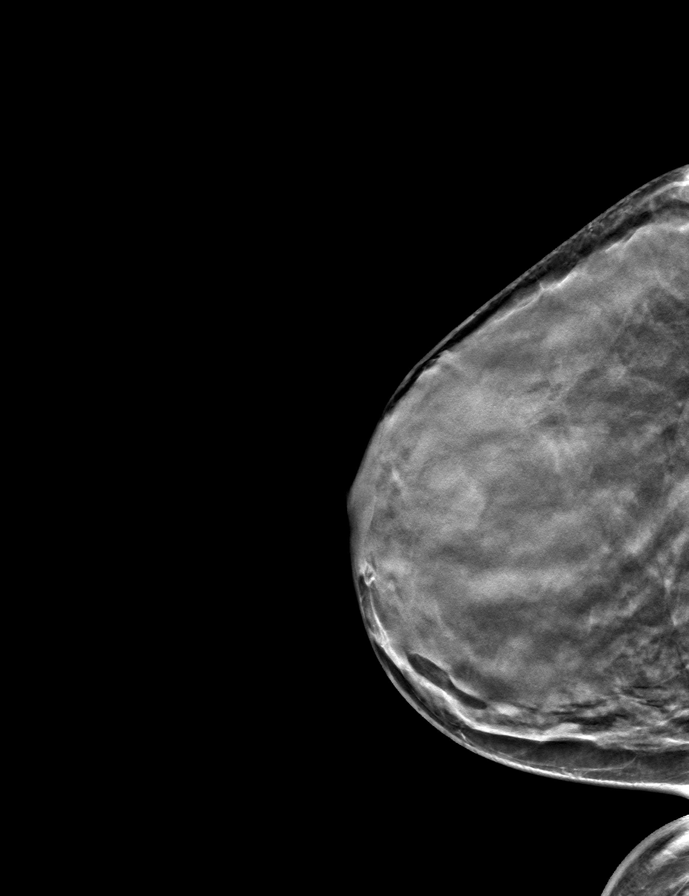

[L CC tomo · tomo slice 24/47.0]
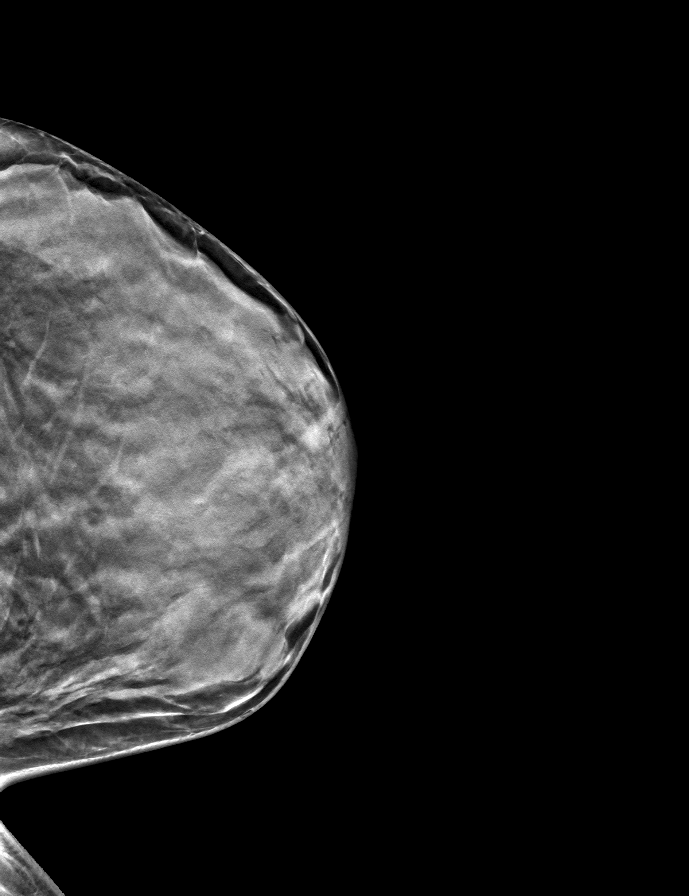

[R MLO tomo · tomo slice 25/48.0]
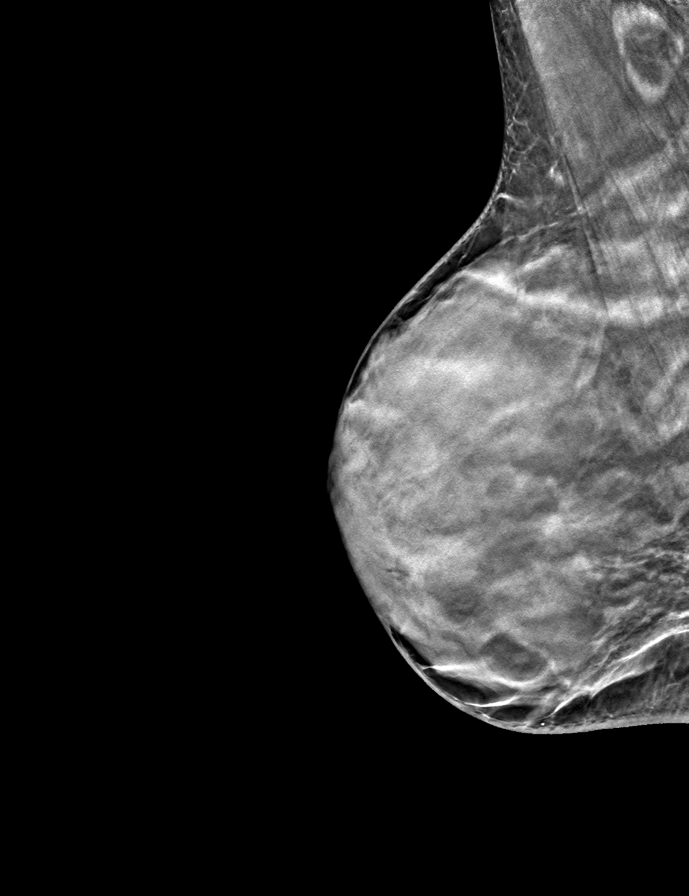

[L MLO tomo · tomo slice 25/48.0]
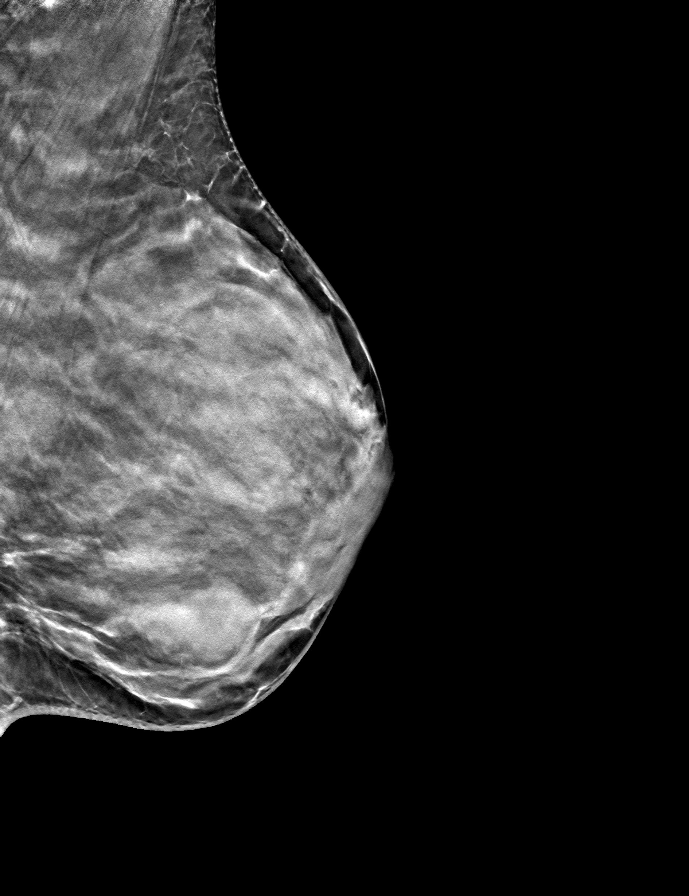

[9 of 24 positions shown; findings below may reference images not displayed]

ACR Breast Density Category d: The breast tissue is extremely dense,
which lowers the sensitivity of mammography
FINDINGS: There are no findings suspicious for malignancy.
IMPRESSION: No mammographic evidence of malignancy. A result letter of this
screening mammogram will be mailed directly to the patient.

RECOMMENDATION:
Screening mammogram in one year. (Code:TA-V-WV9)

BI-RADS CATEGORY  1: Negative.
